# Patient Record
Sex: Female | Born: 1975
Health system: Southern US, Community
[De-identification: ages and names within clinical notes are randomized; demographics above are authoritative.]

## PROBLEM LIST (undated history)

## (undated) DIAGNOSIS — E079 Disorder of thyroid, unspecified: Secondary | ICD-10-CM

## (undated) DIAGNOSIS — R51 Headache: Secondary | ICD-10-CM

## (undated) DIAGNOSIS — E039 Hypothyroidism, unspecified: Secondary | ICD-10-CM

## (undated) DIAGNOSIS — G43909 Migraine, unspecified, not intractable, without status migrainosus: Secondary | ICD-10-CM

## (undated) DIAGNOSIS — R519 Headache, unspecified: Secondary | ICD-10-CM

## (undated) DIAGNOSIS — F419 Anxiety disorder, unspecified: Secondary | ICD-10-CM

## (undated) HISTORY — DX: Headache, unspecified: R51.9

## (undated) HISTORY — DX: Headache: R51

## (undated) HISTORY — DX: Migraine, unspecified, not intractable, without status migrainosus: G43.909

## (undated) HISTORY — DX: Disorder of thyroid, unspecified: E07.9

---
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
## 2004-02-28 DIAGNOSIS — E079 Disorder of thyroid, unspecified: Secondary | ICD-10-CM

## 2004-02-28 HISTORY — DX: Disorder of thyroid, unspecified: E07.9

## 2005-11-03 ENCOUNTER — Ambulatory Visit: Payer: Self-pay | Admitting: Unknown Physician Specialty

## 2005-12-20 ENCOUNTER — Ambulatory Visit: Payer: Self-pay | Admitting: Otolaryngology

## 2006-05-12 ENCOUNTER — Ambulatory Visit: Payer: Self-pay | Admitting: Otolaryngology

## 2006-08-15 ENCOUNTER — Ambulatory Visit: Payer: Self-pay | Admitting: Pain Medicine

## 2008-09-30 ENCOUNTER — Emergency Department: Payer: Self-pay | Admitting: Emergency Medicine

## 2010-01-18 ENCOUNTER — Emergency Department: Payer: Self-pay | Admitting: Internal Medicine

## 2012-05-09 ENCOUNTER — Ambulatory Visit: Payer: Self-pay | Admitting: General Practice

## 2012-06-03 ENCOUNTER — Ambulatory Visit: Payer: Self-pay | Admitting: General Surgery

## 2012-12-30 LAB — HM PAP SMEAR: HM Pap smear: NORMAL

## 2013-03-31 LAB — HM MAMMOGRAPHY: HM Mammogram: NORMAL

## 2013-04-27 LAB — BASIC METABOLIC PANEL: Creatinine: 0.8 mg/dL (ref <=1.1)

## 2013-05-20 LAB — TSH: TSH: 16.55 u[IU]/mL — AB (ref <=5.90)

## 2014-01-27 LAB — BASIC METABOLIC PANEL
BUN: 12 mg/dL (ref 4–21)
Glucose: 87 mg/dL
Potassium: 4 mmol/L (ref 3.4–5.3)
Sodium: 137 mmol/L (ref 137–147)

## 2014-01-27 LAB — HEPATIC FUNCTION PANEL
ALT: 12 U/L (ref 7–35)
AST: 43 U/L — AB (ref 13–35)
Bilirubin, Total: 0.3 mg/dL

## 2014-01-27 LAB — LIPID PANEL
Cholesterol: 45 mg/dL (ref 0–200)
HDL: 45 mg/dL (ref 35–70)
LDL Cholesterol: 107 mg/dL
LDl/HDL Ratio: 3.9
TRIGLYCERIDES: 115 mg/dL (ref 40–160)

## 2014-01-27 LAB — CBC AND DIFFERENTIAL
HCT: 37 % (ref 36–46)
Hemoglobin: 12.4 g/dL (ref 12.0–16.0)
PLATELETS: 275 10*3/uL (ref 150–399)
WBC: 10.5 10^3/mL

## 2014-04-27 ENCOUNTER — Encounter: Payer: Self-pay | Admitting: Nurse Practitioner

## 2014-04-27 ENCOUNTER — Encounter (INDEPENDENT_AMBULATORY_CARE_PROVIDER_SITE_OTHER): Payer: Self-pay

## 2014-04-27 ENCOUNTER — Ambulatory Visit (INDEPENDENT_AMBULATORY_CARE_PROVIDER_SITE_OTHER): Payer: Self-pay | Admitting: Nurse Practitioner

## 2014-04-27 VITALS — BP 120/74 | HR 84 | Temp 97.4°F | Resp 12 | Ht 65.0 in | Wt 166.8 lb

## 2014-04-27 DIAGNOSIS — Z7189 Other specified counseling: Secondary | ICD-10-CM

## 2014-04-27 DIAGNOSIS — Z7689 Persons encountering health services in other specified circumstances: Secondary | ICD-10-CM

## 2014-04-27 DIAGNOSIS — N6320 Unspecified lump in the left breast, unspecified quadrant: Secondary | ICD-10-CM | POA: Insufficient documentation

## 2014-04-27 DIAGNOSIS — E039 Hypothyroidism, unspecified: Secondary | ICD-10-CM | POA: Insufficient documentation

## 2014-04-27 DIAGNOSIS — Z1239 Encounter for other screening for malignant neoplasm of breast: Secondary | ICD-10-CM | POA: Insufficient documentation

## 2014-04-27 DIAGNOSIS — N63 Unspecified lump in breast: Secondary | ICD-10-CM

## 2014-04-27 LAB — TSH: TSH: 0.46 u[IU]/mL (ref 0.35–4.50)

## 2014-04-27 LAB — T4, FREE: FREE T4: 1.27 ng/dL (ref 0.60–1.60)

## 2014-04-27 MED ORDER — ESCITALOPRAM OXALATE 10 MG PO TABS: 10.0000 mg | ORAL_TABLET | Freq: Every day | ORAL | Status: DC

## 2014-04-27 MED ORDER — LEVOTHYROXINE SODIUM 200 MCG PO TABS: 200.0000 ug | ORAL_TABLET | Freq: Every day | ORAL | Status: DC

## 2014-04-27 NOTE — Assessment & Plan Note (Signed)
Will refer for diagnostic mammogram and US of left breast.

## 2014-04-27 NOTE — Assessment & Plan Note (Signed)
Discussed acute and chronic issues. Reviewed health maintenance measures, PFSHx, and immunizations.   

## 2014-04-27 NOTE — Progress Notes (Addendum)
Subjective:    Patient ID: Rachel Hurst, female    DOB: 06/29/1975, 39 y.o.   MRN: [id number]  HPI  [name] is a 39 yo female here to establish care.   1) Health Maintenance-   Diet- Normal diet  Exercise- No formal   Immunizations- Up to date   Mammogram- Had one in past 2014 had diagnostic mammogram of left breast and Ultrasound. Was to follow up in 6 months, but missed due to mother being hospitalized.   Pap- 1.5 years ago   Eye Exam- A few years ago  Dental Exam- Up to date   2) Chronic Problems-  Frequent headaches/Migraines- controlled with topamax  Hypothyroidism- Seen previously by [facility]   150 mg daily for 3 weeks because she ran out of 200 mg    A few months ago was last check up   Taking appropriately on empty stomach in am   Anxiety controlled with Lexapro 3) Acute Problems-  Wants thyroid checked again today. Ran out of 200 mg and previous office would not refill. She has been taking 150 mg pills leftover. Needs refill  See mammogram above   Review of Systems  Constitutional: Positive for diaphoresis. Negative for fever, chills and fatigue.       Night sweats 2-3 x a week   HENT: Negative for tinnitus and trouble swallowing.   Eyes: Negative for visual disturbance.  Respiratory: Negative for chest tightness, shortness of breath and wheezing.   Cardiovascular: Negative for chest pain, palpitations and leg swelling.  Gastrointestinal: Negative for nausea, vomiting, diarrhea and constipation.  Endocrine: Positive for cold intolerance. Negative for heat intolerance.  Genitourinary: Negative for dysuria.  Musculoskeletal: Negative for myalgias and arthralgias.  Skin: Negative for rash.  Neurological: Negative for dizziness, weakness, numbness and headaches.  Hematological: Does not bruise/bleed easily.  Psychiatric/Behavioral: Negative for suicidal ideas. The patient is nervous/anxious.    Past Medical History  Diagnosis Date  . Frequent headaches     . Migraine   . Thyroid disease 2006    Low thyroid    History   Social History  . Marital Status: Married    Spouse Name: N/A  . Number of Children: N/A  . Years of Education: N/A   Occupational History  . Not on file.   Social History Main Topics  . Smoking status: Never Smoker   . Smokeless tobacco: Never Used  . Alcohol Use: 0.6 oz/week    1 Glasses of wine per week  . Drug Use: Not on file  . Sexual Activity: Yes   Other Topics Concern  . Not on file   Social History Narrative   Works third shift       Past Surgical History  Procedure Laterality Date  . Tonsillectomy and adenoidectomy  1976    Family History  Problem Relation Age of Onset  . Hyperlipidemia Mother   . Hyperlipidemia Sister   . Diabetes Sister     No Known Allergies  No current outpatient prescriptions on file prior to visit.   No current facility-administered medications on file prior to visit.      Objective:   Physical Exam  Constitutional: She is oriented to person, place, and time. She appears well-developed and well-nourished. No distress.  BP 120/74 mmHg  Pulse 84  Temp(Src) 97.4 F (36.3 C) (Oral)  Resp 12  Ht 5\' 5"  (1.651 m)  Wt 166 lb 12.8 oz (75.66 kg)  BMI 27.76 kg/m2  SpO2 98%  LMP    HENT:  Head: Normocephalic and atraumatic.  Right Ear: External ear normal.  Left Ear: External ear normal.  Eyes: Right eye exhibits no discharge. Left eye exhibits no discharge. No scleral icterus.  Neck: Normal range of motion. Neck supple. No thyromegaly present.  Cardiovascular: Normal rate, regular rhythm and normal heart sounds.  Exam reveals no gallop and no friction rub.   No murmur heard. Pulmonary/Chest: Effort normal and breath sounds normal. No respiratory distress. She has no wheezes. She has no rales. She exhibits no tenderness.    Lymphadenopathy:    She has no cervical adenopathy.  Neurological: She is alert and oriented to person, place, and time. No cranial  nerve deficit. She exhibits normal muscle tone. Coordination normal.  Skin: Skin is warm and dry. No rash noted. She is not diaphoretic.  Psychiatric: She has a normal mood and affect. Her behavior is normal. Judgment and thought content normal.      Assessment & Plan:

## 2014-04-27 NOTE — Progress Notes (Signed)
Pre visit review using our clinic review tool, if applicable. No additional management support is needed unless otherwise documented below in the visit note. 

## 2014-04-27 NOTE — Addendum Note (Signed)
Addended by: Carollee LeitzSS, Rylyn Zawistowski M on: 04/27/2014 09:53 AM   Modules accepted: Orders

## 2014-04-27 NOTE — Assessment & Plan Note (Addendum)
Previous hypothyroidism. Will check labs TSH, T4 free, T3, and TPO for further information. She reports it was never followed closely and that they upped her each time to a different dose of medication. Would like complete panel for thyroid. If anything is abnormal will possibly refer to Dr. Welford RochePhadke. Discussed with pt and she is agreeable if this is needed. Refilled 200 mg Levothyroxine for the time being since she is out currently.

## 2014-04-27 NOTE — Patient Instructions (Signed)
Hypothyroidism The thyroid is a large gland located in the lower front of your neck. The thyroid gland helps control metabolism. Metabolism is how your body handles food. It controls metabolism with the hormone thyroxine. When this gland is underactive (hypothyroid), it produces too little hormone.  CAUSES These include:   Absence or destruction of thyroid tissue.  Goiter due to iodine deficiency.  Goiter due to medications.  Congenital defects (since birth).  Problems with the pituitary. This causes a lack of TSH (thyroid stimulating hormone). This hormone tells the thyroid to turn out more hormone. SYMPTOMS  Lethargy (feeling as though you have no energy)  Cold intolerance  Weight gain (in spite of normal food intake)  Dry skin  Coarse hair  Menstrual irregularity (if severe, may lead to infertility)  Slowing of thought processes Cardiac problems are also caused by insufficient amounts of thyroid hormone. Hypothyroidism in the newborn is cretinism, and is an extreme form. It is important that this form be treated adequately and immediately or it will lead rapidly to retarded physical and mental development. DIAGNOSIS  To prove hypothyroidism, your caregiver may do blood tests and ultrasound tests. Sometimes the signs are hidden. It may be necessary for your caregiver to watch this illness with blood tests either before or after diagnosis and treatment. TREATMENT  Low levels of thyroid hormone are increased by using synthetic thyroid hormone. This is a safe, effective treatment. It usually takes about four weeks to gain the full effects of the medication. After you have the full effect of the medication, it will generally take another four weeks for problems to leave. Your caregiver may start you on low doses. If you have had heart problems the dose may be gradually increased. It is generally not an emergency to get rapidly to normal. HOME CARE INSTRUCTIONS   Take your  medications as your caregiver suggests. Let your caregiver know of any medications you are taking or start taking. Your caregiver will help you with dosage schedules.  As your condition improves, your dosage needs may increase. It will be necessary to have continuing blood tests as suggested by your caregiver.  Report all suspected medication side effects to your caregiver. SEEK MEDICAL CARE IF: Seek medical care if you develop:  Sweating.  Tremulousness (tremors).  Anxiety.  Rapid weight loss.  Heat intolerance.  Emotional swings.  Diarrhea.  Weakness. SEEK IMMEDIATE MEDICAL CARE IF:  You develop chest pain, an irregular heart beat (palpitations), or a rapid heart beat. MAKE SURE YOU:   Understand these instructions.  Will watch your condition.  Will get help right away if you are not doing well or get worse. Document Released: 02/13/2005 Document Revised: 05/08/2011 Document Reviewed: 10/04/2007 Exit[employer] Patient Information 2015 [employer]. This information is not intended to replace advice given to you by your health care provider. Make sure you discuss any questions you have with your health care provider.  

## 2014-04-28 LAB — THYROID PEROXIDASE ANTIBODY: THYROID PEROXIDASE ANTIBODY: 109 [IU]/mL — AB (ref <9)

## 2014-04-28 LAB — T3: T3 TOTAL: 93.3 ng/dL (ref 80.0–204.0)

## 2014-05-13 ENCOUNTER — Encounter: Payer: Self-pay | Admitting: Nurse Practitioner

## 2014-05-29 ENCOUNTER — Other Ambulatory Visit: Payer: Self-pay | Admitting: Nurse Practitioner

## 2014-06-11 ENCOUNTER — Encounter (INDEPENDENT_AMBULATORY_CARE_PROVIDER_SITE_OTHER): Payer: Self-pay

## 2014-06-24 ENCOUNTER — Ambulatory Visit
Admit: 2014-06-24 | Disposition: A | Discharge status: Home / Self Care | Payer: Self-pay | Attending: Nurse Practitioner | Admitting: Nurse Practitioner

## 2014-06-24 ENCOUNTER — Other Ambulatory Visit: Payer: Self-pay | Admitting: Nurse Practitioner

## 2014-06-24 DIAGNOSIS — Z1239 Encounter for other screening for malignant neoplasm of breast: Secondary | ICD-10-CM

## 2014-10-02 ENCOUNTER — Other Ambulatory Visit: Payer: Self-pay | Admitting: Nurse Practitioner

## 2014-10-19 ENCOUNTER — Encounter: Payer: Self-pay | Admitting: Nurse Practitioner

## 2015-02-17 ENCOUNTER — Other Ambulatory Visit: Payer: Self-pay | Admitting: Nurse Practitioner

## 2015-08-03 ENCOUNTER — Encounter: Payer: Self-pay | Admitting: Nurse Practitioner

## 2015-08-04 ENCOUNTER — Other Ambulatory Visit: Payer: Self-pay | Admitting: Family Medicine

## 2015-08-04 MED ORDER — LEVOTHYROXINE SODIUM 200 MCG PO TABS: 200.0000 ug | ORAL_TABLET | Freq: Every day | ORAL | Status: DC

## 2015-09-16 ENCOUNTER — Emergency Department: Payer: Self-pay

## 2015-09-16 ENCOUNTER — Inpatient Hospital Stay
Admission: EM | Admit: 2015-09-16 | Discharge: 2015-09-20 | DRG: 419 | Disposition: A | Discharge status: Home / Self Care | Payer: Self-pay | Attending: Internal Medicine | Admitting: Internal Medicine

## 2015-09-16 DIAGNOSIS — K8062 Calculus of gallbladder and bile duct with acute cholecystitis without obstruction: Principal | ICD-10-CM | POA: Diagnosis present

## 2015-09-16 DIAGNOSIS — K769 Liver disease, unspecified: Secondary | ICD-10-CM | POA: Diagnosis present

## 2015-09-16 DIAGNOSIS — R1011 Right upper quadrant pain: Secondary | ICD-10-CM | POA: Diagnosis present

## 2015-09-16 DIAGNOSIS — K8020 Calculus of gallbladder without cholecystitis without obstruction: Secondary | ICD-10-CM | POA: Insufficient documentation

## 2015-09-16 DIAGNOSIS — R52 Pain, unspecified: Secondary | ICD-10-CM

## 2015-09-16 DIAGNOSIS — K8050 Calculus of bile duct without cholangitis or cholecystitis without obstruction: Secondary | ICD-10-CM | POA: Insufficient documentation

## 2015-09-16 DIAGNOSIS — G43909 Migraine, unspecified, not intractable, without status migrainosus: Secondary | ICD-10-CM | POA: Diagnosis present

## 2015-09-16 DIAGNOSIS — K660 Peritoneal adhesions (postprocedural) (postinfection): Secondary | ICD-10-CM | POA: Diagnosis present

## 2015-09-16 DIAGNOSIS — E039 Hypothyroidism, unspecified: Secondary | ICD-10-CM | POA: Diagnosis present

## 2015-09-16 DIAGNOSIS — F419 Anxiety disorder, unspecified: Secondary | ICD-10-CM | POA: Diagnosis present

## 2015-09-16 HISTORY — DX: Hypothyroidism, unspecified: E03.9

## 2015-09-16 HISTORY — DX: Anxiety disorder, unspecified: F41.9

## 2015-09-16 LAB — URINALYSIS COMPLETE WITH MICROSCOPIC (ARMC ONLY)
BILIRUBIN URINE: NEGATIVE
GLUCOSE, UA: NEGATIVE mg/dL
Hgb urine dipstick: NEGATIVE
Ketones, ur: NEGATIVE mg/dL
Nitrite: NEGATIVE
Protein, ur: NEGATIVE mg/dL
Specific Gravity, Urine: 1.024 (ref 1.005–1.030)
pH: 6 (ref 5.0–8.0)

## 2015-09-16 LAB — CBC
HCT: 41.8 % (ref 35.0–47.0)
Hemoglobin: 14.2 g/dL (ref 12.0–16.0)
MCH: 29.2 pg (ref 26.0–34.0)
MCHC: 34 g/dL (ref 32.0–36.0)
MCV: 86 fL (ref 80.0–100.0)
PLATELETS: 232 10*3/uL (ref 150–440)
RBC: 4.87 MIL/uL (ref 3.80–5.20)
RDW: 13.8 % (ref 11.5–14.5)
WBC: 11.8 10*3/uL — ABNORMAL HIGH (ref 3.6–11.0)

## 2015-09-16 LAB — COMPREHENSIVE METABOLIC PANEL
ALK PHOS: 45 U/L (ref 38–126)
ALT: 16 U/L (ref 14–54)
AST: 26 U/L (ref 15–41)
Albumin: 4.4 g/dL (ref 3.5–5.0)
Anion gap: 7 (ref 5–15)
BUN: 15 mg/dL (ref 6–20)
CALCIUM: 9.7 mg/dL (ref 8.9–10.3)
CHLORIDE: 106 mmol/L (ref 101–111)
CO2: 25 mmol/L (ref 22–32)
CREATININE: 0.95 mg/dL (ref 0.44–1.00)
GFR calc Af Amer: >60 mL/min (ref >60)
Glucose, Bld: 112 mg/dL — ABNORMAL HIGH (ref 65–99)
Potassium: 4.3 mmol/L (ref 3.5–5.1)
SODIUM: 138 mmol/L (ref 135–145)
Total Bilirubin: 0.6 mg/dL (ref 0.3–1.2)
Total Protein: 7.8 g/dL (ref 6.5–8.1)

## 2015-09-16 LAB — POCT PREGNANCY, URINE: PREG TEST UR: NEGATIVE

## 2015-09-16 LAB — TROPONIN I: Troponin I: <0.03 ng/mL (ref <0.03)

## 2015-09-16 LAB — LIPASE, BLOOD: LIPASE: 30 U/L (ref 11–51)

## 2015-09-16 MED ORDER — ACETAMINOPHEN 325 MG PO TABS
650.0000 mg | ORAL_TABLET | Freq: Once | ORAL | Status: AC
  Administered 2015-09-16: 650 mg via ORAL
  Filled 2015-09-16: qty 2

## 2015-09-16 MED ORDER — SODIUM CHLORIDE 0.9 % IV BOLUS (SEPSIS)
1000.0000 mL | Freq: Once | INTRAVENOUS | Status: AC
  Administered 2015-09-16: 1000 mL via INTRAVENOUS

## 2015-09-16 MED ORDER — MORPHINE SULFATE (PF) 4 MG/ML IV SOLN
4.0000 mg | Freq: Once | INTRAVENOUS | Status: AC
  Administered 2015-09-16: 4 mg via INTRAVENOUS
  Filled 2015-09-16: qty 1

## 2015-09-16 MED ORDER — GADOBENATE DIMEGLUMINE 529 MG/ML IV SOLN
15.0000 mL | Freq: Once | INTRAVENOUS | Status: AC | PRN
  Administered 2015-09-16: 15 mL via INTRAVENOUS

## 2015-09-16 MED ORDER — ONDANSETRON HCL 4 MG/2ML IJ SOLN
4.0000 mg | Freq: Once | INTRAMUSCULAR | Status: AC
  Administered 2015-09-16: 4 mg via INTRAVENOUS
  Filled 2015-09-16: qty 2

## 2015-09-16 MED ORDER — GI COCKTAIL ~~LOC~~
30.0000 mL | Freq: Once | ORAL | Status: DC
  Filled 2015-09-16: qty 30

## 2015-09-16 NOTE — ED Provider Notes (Signed)
[facility] Emergency Department Provider Note  Time seen: 1:25 PM  I have reviewed the triage vital signs and the nursing notes.   HISTORY  Chief Complaint Abdominal Pain    HPI ORIAN AMBERG is a 40 y.o. female with no past medical history who presents the emergency department with upper abdominal pain for the past 2 days. According to the patient 2 days ago she developed upper abdominal pain along with nausea, and a feeling of flushing/lightheadedness. Patient states since that time she is continued to have abdominal pain, somewhat worse after she eats. Also states occasional loose stool. Denies any further nausea or vomiting. Denies any fever. Denies dysuria or lower abdominal discomfort. Describes her pain currently as moderate dull upper abdominal pain 4/10.     History reviewed. No pertinent past medical history.  There are no active problems to display for this patient.   History reviewed. No pertinent past surgical history.  No current outpatient prescriptions on file.  Allergies Review of patient's allergies indicates no known allergies.  No family history on file.  Social History Social History  Substance Use Topics  . Smoking status: Never Smoker   . Smokeless tobacco: None  . Alcohol Use: Yes    Review of Systems Constitutional: Negative for fever. Cardiovascular: Negative for chest pain. Respiratory: Negative for shortness of breath. Gastrointestinal: Moderate dull upper abdominal pain, 4/10. Negative for nausea, vomiting, diarrhea. Genitourinary: Negative for dysuria. Musculoskeletal: Patient states some radiation of the pain to her right mid back Neurological: Negative for headache 10-point ROS otherwise negative.  ____________________________________________   PHYSICAL EXAM:  VITAL SIGNS: ED Triage Vitals  Enc Vitals Group     BP 09/16/15 1107 112/59 mmHg     Pulse Rate 09/16/15 1107 83     Resp 09/16/15 1107 22      Temp 09/16/15 1107 97.9 F (36.6 C)     Temp Source 09/16/15 1107 Oral     SpO2 09/16/15 1107 100 %     Weight 09/16/15 1107 170 lb (77.111 kg)     Height 09/16/15 1107  (1.626 m)     Head Cir --      Peak Flow --      Pain Score 09/16/15 1108 8     Pain Loc --      Pain Edu? --      Excl. in GC? --    Constitutional: Alert and oriented. Well appearing and in no distress. Eyes: Normal exam ENT   Head: Normocephalic and atraumatic.   Mouth/Throat: Mucous membranes are moist. Cardiovascular: Normal rate, regular rhythm. No murmur Respiratory: Normal respiratory effort without tachypnea nor retractions. Breath sounds are clear  Gastrointestinal: Soft, mild epigastric tenderness palpation, no rebound or guarding. No distention.  Musculoskeletal: Nontender with normal range of motion in all extremities Neurologic:  Normal speech and language. No gross focal neurologic deficits Skin:  Skin is warm, dry and intact.  Psychiatric: Mood and affect are normal.  ____________________________________________    EKG  EKG reviewed and interpreted by massage shows normal sinus rhythm at 87 bpm, narrow QRS, normal axis, normal intervals, nonspecific ST changes without ST elevation.  ____________________________________________    RADIOLOGY  Ultrasound shows multiple gallstones with a 1 cm CBD.  ____________________________________________    INITIAL IMPRESSION / ASSESSMENT AND PLAN / ED COURSE  Pertinent labs & imaging results that were available during my care of the patient were reviewed by me and considered in my medical decision making (  see chart for details).  Patient presents the emergency department upper abdominal pain for the past 2 days initially with nausea and vomiting, none since. Denies dysuria or any other lower abdominal symptoms. Patient does have mild epigastric tenderness palpation. Patient describes moderate abdominal pain somewhat worse after she  eats. We will obtain a right upper quadrant which sound to further evaluate the patient's gallbladder. Otherwise the patient's lab work shows a mild leukocytosis of 11,800, with normal LFTs.  We have no GI coverage today, I discussed the patient with [facility] who recommends we obtain an MRCP. MRCP is positive for stones and hypertension are [facility] to be seen by GI medicine. If the MRCP is negative we will consult Gen. surgery to have the patient evaluated. Patient states 6/10 pain currently she is receiving for more milligrams of morphine at this time.  MRCP pending, patient care signed out to Dr. Emmit AlexandersMcSchane.  ____________________________________________   FINAL CLINICAL IMPRESSION(S) / ED DIAGNOSES  Biliary colic Cholelithiasis   Minna AntisKevin Aziah Kaiser, MD 09/16/15 1527

## 2015-09-16 NOTE — ED Notes (Signed)
Administered medication to patient and started fluids per MAR.  Pt appears comfortable and is watching TV and talking with family at bedside.

## 2015-09-16 NOTE — ED Notes (Signed)
Pt resting in bed at this time. VSS. Will continue to monitor for further patient needs. Explained to patient to delay for going to MRI at this time.

## 2015-09-16 NOTE — ED Notes (Signed)
Pt to MRI prior to start of shift.

## 2015-09-16 NOTE — ED Notes (Signed)
MD at bedside. 

## 2015-09-16 NOTE — ED Notes (Signed)
Pt back from MRI, pt complaining of headache at this time.  EDP notified and orders placed.

## 2015-09-16 NOTE — ED Notes (Signed)
EDP requested that pt be given food.  Sandwich tray and ice water provided for patient.  Patient reporting that headache still present, will notify EDP.

## 2015-09-16 NOTE — ED Notes (Signed)
Pt arrives to ER via POV c/o right and left upper abdominal pain. Denies CP. Pt alert and oriented X4, active, cooperative, pt in NAD. RR even and unlabored, color WNL.

## 2015-09-16 NOTE — ED Notes (Signed)
Pt returned from US at this time.

## 2015-09-16 NOTE — ED Notes (Signed)
Pt states per MRI her daith peircings have to come out. This RN loosened piercings and instructed patient and MRI that upon her arrival they needed to be removed, pt was hesitant to remove and let the piercing stay out for an extended period of time.

## 2015-09-16 NOTE — ED Provider Notes (Addendum)
-----------------------------------------   5:27 PM on 09/16/2015 ----------------------------------------- Signed out to me awaiting MRCP.  Jeanmarie PlantJames A Knut Rondinelli, MD   ----------------------------------------- 8:31 PM on 09/16/2015 -----------------------------------------  Patient's in no acute distress has a mild headache with like Tylenol with her to eat and drink, still with reproducible abdominal discomfort but no significant pain or fraction tests are reassuring awaiting MRCP results for dispositional decisions  Jeanmarie PlantJames A Greysen Swanton, MD 09/16/15 2031  ----------------------------------------- 10:07 PM on 09/16/2015 -----------------------------------------  [facility] still awaiting a call back. She material 10 but ongoing discomfort. Declines further pain medication.   ----------------------------------------- 10:56 PM on 09/16/2015 -----------------------------------------  Had extensive discussion with Dr. Cheryl FlashSchuler of [facility] who graciously called me back. He feels that patient does not [facility]. We are told that there will be a GI doctor on-call tomorrow. This no evidence of obstruction, and her liver function tests and lipase are reassuring. For this reason therefore even though she has stones in the common bile duct he is advised that she stay here until Dr. Mechele CollinElliott, who I am told is on call tomorrow, can evaluate the patient. He does not, and otherwise, except the patient. I discussed with the hospitalist on call here and she agrees with the plan and will admit. Patient will try to eat although she has been reluctant to try prior to this because of abdominal pain. Hospitalist agrees that as there is no procedure being scheduled for tonight, patient can take by mouth.  Jeanmarie PlantJames A Mabel Unrein, MD 09/16/15 2258

## 2015-09-16 NOTE — ED Notes (Signed)
Pt given phone for MRI questions.

## 2015-09-17 ENCOUNTER — Encounter
Admission: EM | Disposition: A | Discharge status: Home / Self Care | Payer: Self-pay | Source: Home / Self Care | Attending: Internal Medicine

## 2015-09-17 ENCOUNTER — Inpatient Hospital Stay: Payer: Self-pay

## 2015-09-17 ENCOUNTER — Inpatient Hospital Stay: Payer: MEDICAID | Admitting: Anesthesiology

## 2015-09-17 ENCOUNTER — Other Ambulatory Visit: Payer: Self-pay

## 2015-09-17 ENCOUNTER — Inpatient Hospital Stay: Payer: Self-pay | Admitting: Anesthesiology

## 2015-09-17 ENCOUNTER — Encounter: Payer: Self-pay | Admitting: Family Medicine

## 2015-09-17 DIAGNOSIS — K8050 Calculus of bile duct without cholangitis or cholecystitis without obstruction: Secondary | ICD-10-CM | POA: Insufficient documentation

## 2015-09-17 DIAGNOSIS — R1011 Right upper quadrant pain: Secondary | ICD-10-CM | POA: Diagnosis present

## 2015-09-17 HISTORY — PX: ERCP: SHX5425

## 2015-09-17 LAB — COMPREHENSIVE METABOLIC PANEL
ALBUMIN: 3.6 g/dL (ref 3.5–5.0)
ALBUMIN: 3.6 g/dL (ref 3.5–5.0)
ALK PHOS: 45 U/L (ref 38–126)
ALK PHOS: 44 U/L (ref 38–126)
ALT: 38 U/L (ref 14–54)
ALT: 35 U/L (ref 14–54)
ANION GAP: 2 — AB (ref 5–15)
AST: 32 U/L (ref 15–41)
AST: 25 U/L (ref 15–41)
Anion gap: 3 — ABNORMAL LOW (ref 5–15)
BILIRUBIN TOTAL: 0.9 mg/dL (ref 0.3–1.2)
BILIRUBIN TOTAL: 0.6 mg/dL (ref 0.3–1.2)
BUN: 11 mg/dL (ref 6–20)
BUN: 7 mg/dL (ref 6–20)
CALCIUM: 8.2 mg/dL — AB (ref 8.9–10.3)
CALCIUM: 8.4 mg/dL — AB (ref 8.9–10.3)
CO2: 26 mmol/L (ref 22–32)
CO2: 22 mmol/L (ref 22–32)
Chloride: 111 mmol/L (ref 101–111)
Chloride: 112 mmol/L — ABNORMAL HIGH (ref 101–111)
Creatinine, Ser: 0.85 mg/dL (ref 0.44–1.00)
Creatinine, Ser: 0.78 mg/dL (ref 0.44–1.00)
GFR calc Af Amer: >60 mL/min (ref >60)
GFR calc Af Amer: >60 mL/min (ref >60)
GFR calc non Af Amer: >60 mL/min (ref >60)
GLUCOSE: 99 mg/dL (ref 65–99)
GLUCOSE: 100 mg/dL — AB (ref 65–99)
Potassium: 4 mmol/L (ref 3.5–5.1)
Potassium: 4 mmol/L (ref 3.5–5.1)
Sodium: 139 mmol/L (ref 135–145)
Sodium: 137 mmol/L (ref 135–145)
TOTAL PROTEIN: 6.4 g/dL — AB (ref 6.5–8.1)
TOTAL PROTEIN: 6.4 g/dL — AB (ref 6.5–8.1)

## 2015-09-17 LAB — CBC
HCT: 36.4 % (ref 35.0–47.0)
HEMOGLOBIN: 12.8 g/dL (ref 12.0–16.0)
MCH: 30.4 pg (ref 26.0–34.0)
MCHC: 35.2 g/dL (ref 32.0–36.0)
MCV: 86.2 fL (ref 80.0–100.0)
Platelets: 190 10*3/uL (ref 150–440)
RBC: 4.22 MIL/uL (ref 3.80–5.20)
RDW: 14 % (ref 11.5–14.5)
WBC: 8.8 10*3/uL (ref 3.6–11.0)

## 2015-09-17 LAB — PROTIME-INR
INR: 1.23
PROTHROMBIN TIME: 15.7 s — AB (ref 11.4–15.0)

## 2015-09-17 LAB — APTT: APTT: 30 s (ref 24–36)

## 2015-09-17 SURGERY — ERCP, WITH INTERVENTION IF INDICATED
Anesthesia: General

## 2015-09-17 MED ORDER — BUTALBITAL-APAP-CAFFEINE 50-325-40 MG PO TABS
1.0000 | ORAL_TABLET | ORAL | Status: DC | PRN
  Administered 2015-09-17 – 2015-09-20 (×4): 1 via ORAL
  Filled 2015-09-17 (×4): qty 1

## 2015-09-17 MED ORDER — PROPOFOL 500 MG/50ML IV EMUL
INTRAVENOUS | Status: DC | PRN
  Administered 2015-09-17: 100 ug/kg/min via INTRAVENOUS

## 2015-09-17 MED ORDER — OXYCODONE-ACETAMINOPHEN 5-325 MG PO TABS
ORAL_TABLET | ORAL | Status: AC
  Filled 2015-09-17: qty 1

## 2015-09-17 MED ORDER — ESCITALOPRAM OXALATE 10 MG PO TABS
10.0000 mg | ORAL_TABLET | Freq: Every day | ORAL | Status: DC
  Administered 2015-09-17 – 2015-09-20 (×4): 10 mg via ORAL
  Filled 2015-09-17 (×4): qty 1

## 2015-09-17 MED ORDER — ONDANSETRON HCL 4 MG/2ML IJ SOLN
INTRAMUSCULAR | Status: DC | PRN
  Administered 2015-09-17: 4 mg via INTRAVENOUS

## 2015-09-17 MED ORDER — ACETAMINOPHEN 650 MG RE SUPP: 650.0000 mg | Freq: Four times a day (QID) | RECTAL | Status: DC | PRN

## 2015-09-17 MED ORDER — INDOMETHACIN 50 MG RE SUPP
100.0000 mg | Freq: Once | RECTAL | Status: AC
  Administered 2015-09-17: 100 mg via RECTAL
  Filled 2015-09-17: qty 2

## 2015-09-17 MED ORDER — ONDANSETRON HCL 4 MG/2ML IJ SOLN: 4.0000 mg | Freq: Once | INTRAMUSCULAR | Status: DC | PRN

## 2015-09-17 MED ORDER — FENTANYL CITRATE (PF) 100 MCG/2ML IJ SOLN: 25.0000 ug | INTRAMUSCULAR | Status: DC | PRN

## 2015-09-17 MED ORDER — ACETAMINOPHEN 325 MG PO TABS
650.0000 mg | ORAL_TABLET | Freq: Four times a day (QID) | ORAL | Status: DC | PRN
  Administered 2015-09-17: 09:00:00 650 mg via ORAL
  Filled 2015-09-17: qty 2

## 2015-09-17 MED ORDER — FENTANYL CITRATE (PF) 100 MCG/2ML IJ SOLN
INTRAMUSCULAR | Status: DC | PRN
  Administered 2015-09-17: 50 ug via INTRAVENOUS

## 2015-09-17 MED ORDER — MIDAZOLAM HCL 2 MG/2ML IJ SOLN
INTRAMUSCULAR | Status: DC | PRN
  Administered 2015-09-17: 2 mg via INTRAVENOUS

## 2015-09-17 MED ORDER — OXYCODONE-ACETAMINOPHEN 5-325 MG PO TABS: 1.0000 | ORAL_TABLET | Freq: Once | ORAL | Status: DC

## 2015-09-17 MED ORDER — PROPOFOL 10 MG/ML IV BOLUS
INTRAVENOUS | Status: DC | PRN
  Administered 2015-09-17: 80 mg via INTRAVENOUS

## 2015-09-17 MED ORDER — SODIUM CHLORIDE 0.9 % IV SOLN
INTRAVENOUS | Status: DC
  Administered 2015-09-17: 16:00:00 via INTRAVENOUS

## 2015-09-17 MED ORDER — ONDANSETRON HCL 4 MG/2ML IJ SOLN: 4.0000 mg | Freq: Four times a day (QID) | INTRAMUSCULAR | Status: DC | PRN

## 2015-09-17 MED ORDER — OXYCODONE-ACETAMINOPHEN 5-325 MG PO TABS
1.0000 | ORAL_TABLET | Freq: Once | ORAL | Status: AC
  Administered 2015-09-17: 1 via ORAL
  Filled 2015-09-17: qty 1

## 2015-09-17 MED ORDER — DEXTROSE-NACL 5-0.9 % IV SOLN
INTRAVENOUS | Status: DC
  Administered 2015-09-17 (×3): via INTRAVENOUS

## 2015-09-17 MED ORDER — HYDROMORPHONE HCL 1 MG/ML IJ SOLN
1.0000 mg | Freq: Once | INTRAMUSCULAR | Status: AC
  Administered 2015-09-17: 1 mg via INTRAVENOUS
  Filled 2015-09-17: qty 1

## 2015-09-17 MED ORDER — LEVOFLOXACIN IN D5W 500 MG/100ML IV SOLN
500.0000 mg | Freq: Once | INTRAVENOUS | Status: AC
  Administered 2015-09-17: 500 mg via INTRAVENOUS
  Filled 2015-09-17: qty 100

## 2015-09-17 MED ORDER — LEVOTHYROXINE SODIUM 100 MCG PO TABS
200.0000 ug | ORAL_TABLET | Freq: Every day | ORAL | Status: DC
  Administered 2015-09-17 – 2015-09-20 (×4): 200 ug via ORAL
  Filled 2015-09-17 (×4): qty 2

## 2015-09-17 MED ORDER — ONDANSETRON HCL 4 MG PO TABS: 4.0000 mg | ORAL_TABLET | Freq: Four times a day (QID) | ORAL | Status: DC | PRN

## 2015-09-17 MED ORDER — NAPROXEN 250 MG PO TABS
250.0000 mg | ORAL_TABLET | Freq: Once | ORAL | Status: AC
  Administered 2015-09-17: 250 mg via ORAL
  Filled 2015-09-17 (×2): qty 1

## 2015-09-17 NOTE — Anesthesia Preprocedure Evaluation (Addendum)
Anesthesia Evaluation  Patient identified by MRN, date of birth, ID band Patient awake    Reviewed: Allergy & Precautions, NPO status , Patient's Chart, lab work & pertinent test results, reviewed documented beta blocker date and time   Airway Mallampati: II  TM Distance: >3 FB     Dental  (+) Chipped, Teeth Intact   Pulmonary neg pulmonary ROS,    breath sounds clear to auscultation       Cardiovascular negative cardio ROS   Rhythm:Regular     Neuro/Psych Anxiety negative neurological ROS     GI/Hepatic negative GI ROS, Neg liver ROS,   Endo/Other    Renal/GU      Musculoskeletal negative musculoskeletal ROS (+)   Abdominal Normal abdominal exam  (+)   Peds negative pediatric ROS (+)  Hematology   Anesthesia Other Findings   Reproductive/Obstetrics negative OB ROS                           Anesthesia Physical Anesthesia Plan  ASA: II  Anesthesia Plan: General   Post-op Pain Management:    Induction: Intravenous  Airway Management Planned:   Additional Equipment:   Intra-op Plan:   Post-operative Plan:   Informed Consent: I have reviewed the patients History and Physical, chart, labs and discussed the procedure including the risks, benefits and alternatives for the proposed anesthesia with the patient or authorized representative who has indicated his/her understanding and acceptance.     Plan Discussed with: CRNA  Anesthesia Plan Comments:         Anesthesia Quick Evaluation

## 2015-09-17 NOTE — Progress Notes (Signed)
[facility] [place] at [facility]   PATIENT NAME: Rachel Hurst    MR#:  [id number]  DATE OF BIRTH:  07/28/1975  SUBJECTIVE:  CHIEF COMPLAINT:  Patient is resting comfortably. Reports some right-sided abdominal pain. Denies any nausea or vomiting.  REVIEW OF SYSTEMS:  CONSTITUTIONAL: No fever, fatigue or weakness.  EYES: No blurred or double vision.  EARS, NOSE, AND THROAT: No tinnitus or ear pain.  RESPIRATORY: No cough, shortness of breath, wheezing or hemoptysis.  CARDIOVASCULAR: No chest pain, orthopnea, edema.  GASTROINTESTINAL: No nausea, vomiting, diarrhea . Reports right-sidedbdominal pain.  GENITOURINARY: No dysuria, hematuria.  ENDOCRINE: No polyuria, nocturia,  HEMATOLOGY: No anemia, easy bruising or bleeding SKIN: No rash or lesion. MUSCULOSKELETAL: No joint pain or arthritis.   NEUROLOGIC: No tingling, numbness, weakness.  PSYCHIATRY: No anxiety or depression.   DRUG ALLERGIES:  No Known Allergies  VITALS:  Blood pressure 99/55, pulse 67, temperature 98.8 F (37.1 C), temperature source Oral, resp. rate 16, height 5\' 4"  (1.626 m), weight 81.511 kg (179 lb 11.2 oz), SpO2 98 %.  PHYSICAL EXAMINATION:  GENERAL:  40 y.o.-year-old patient lying in the bed with no acute distress.  EYES: Pupils equal, round, reactive to light and accommodation. No scleral icterus. Extraocular muscles intact.  HEENT: Head atraumatic, normocephalic. Oropharynx and nasopharynx clear.  NECK:  Supple, no jugular venous distention. No thyroid enlargement, no tenderness.  LUNGS: Normal breath sounds bilaterally, no wheezing, rales,rhonchi or crepitation. No use of accessory muscles of respiration.  CARDIOVASCULAR: S1, S2 normal. No murmurs, rubs, or gallops.  ABDOMEN: Soft, Right upper quadrant tenderness is present but no rebound tenderness, nondistended. Bowel sounds present. No organomegaly or mass.  EXTREMITIES: No pedal edema, cyanosis, or clubbing.   NEUROLOGIC: Cranial nerves II through XII are intact. Muscle strength 5/5 in all extremities. Sensation intact. Gait not checked.  PSYCHIATRIC: The patient is alert and oriented x 3.  SKIN: No obvious rash, lesion, or ulcer.    LABORATORY PANEL:   CBC  Recent Labs Lab 09/17/15 0520  WBC 8.8  HGB 12.8  HCT 36.4  PLT 190   ------------------------------------------------------------------------------------------------------------------  Chemistries   Recent Labs Lab 09/17/15 0520  NA 139  K 4.0  CL 111  CO2 26  GLUCOSE 99  BUN 11  CREATININE 0.85  CALCIUM 8.2*  AST 32  ALT 38  ALKPHOS 45  BILITOT 0.9   ------------------------------------------------------------------------------------------------------------------  Cardiac Enzymes  Recent Labs Lab 09/16/15 1111  TROPONINI <0.03   ------------------------------------------------------------------------------------------------------------------  RADIOLOGY:  Mr Abd W/wo Cm/mrcp  09/16/2015  CLINICAL DATA:  Right upper quadrant pain for 2 days. Nausea. Cholelithiasis and biliary ductal dilatation seen on recent ultrasound. EXAM: MRI ABDOMEN WITHOUT AND WITH CONTRAST (INCLUDING MRCP) TECHNIQUE: Multiplanar multisequence MR imaging of the abdomen was performed both before and after the administration of intravenous contrast. Heavily T2-weighted images of the biliary and pancreatic ducts were obtained, and three-dimensional MRCP images were rendered by post processing. CONTRAST:  15mL MULTIHANCE GADOBENATE DIMEGLUMINE 529 MG/ML IV SOLN COMPARISON:  Ultrasound on 09/16/2015 FINDINGS: Lower chest:  No acute findings. Hepatobiliary: 1.5 cm flash filling lesion is seen in the subcapsular posterior right hepatic lobe, segment 7, on arterial phase imaging, image 22/series 17. This shows T2 isointensity, and likely represents a benign etiology such as focal nodular hyperplasia or perfusion anomaly. No other liver lesions  identified. Multiple gallstones are seen without evidence of acute cholecystitis. Mild biliary ductal dilatation is seen with common bile duct measuring 9 mm. There  are several filling defects seen in the distal common bile duct measuring up to 8 mm, consistent with choledocholithiasis. Pancreas: No mass, inflammatory changes, or other parenchymal abnormality identified. Spleen:  Within normal limits in size and appearance. Adrenals/Urinary Tract: No masses identified. No evidence of hydronephrosis. Mild left lower pole renal scarring noted. Stomach/Bowel: Visualized portions within the abdomen are unremarkable. Vascular/Lymphatic: No pathologically enlarged lymph nodes identified. No abdominal aortic aneurysm demonstrated. Other:  None. Musculoskeletal:  No suspicious bone lesions identified. IMPRESSION: Cholelithiasis, without radiographic signs of acute cholecystitis. Mild biliary ductal dilatation, with multiple small stones in the distal common bile duct. 1.5 cm probably benign lesion in the posterior right hepatic lobe. Continued followup by abdomen MRI without and with Eovist contrast recommended in 6 months. Electronically Signed   By: Myles Rosenthal M.D.   On: 09/16/2015 20:35   US Abdomen Limited Ruq  09/16/2015  CLINICAL DATA:  Upper abdominal pain 3 days with vomiting. EXAM: US ABDOMEN LIMITED - RIGHT UPPER QUADRANT COMPARISON:  None. FINDINGS: Gallbladder: Moderate cholelithiasis with the largest stone measuring 1 cm. No gallbladder wall thickening or pericholecystic fluid. Negative sonographic Murphy's sign. Common bile duct: Diameter: 7 mm proximally and 10 mm distally. Possible cluster of small stones versus single 1 cm stone within the distal common bile duct. Liver: No focal lesion identified. Within normal limits in parenchymal echogenicity. IMPRESSION: Moderate cholelithiasis without sonographic evidence of acute cholecystitis. Findings suggesting distal choledocholithiasis with mild dilatation  of the common bowel duct measuring 1 cm. Consider MRCP for further evaluation. Electronically Signed   By: Elberta Fortis M.D.   On: 09/16/2015 14:06    EKG:   Orders placed or performed during the hospital encounter of 09/16/15  . ED EKG  . ED EKG  . EKG 12-Lead  . EKG 12-Lead    ASSESSMENT AND PLAN:   This is a 39 year old white female with history of anxiety, hypothyroidism and migraines who presents with two days of RUQ abdominal pain and now being admitted for: 1. Biliary colic - cholelithiasis, choledocholithiasis without evidence of acute cholecystitis.  MRCP done, ED physician  consulted [facility] has recommended  inpatient GI consultation for consideration of ERCP. Pending GI consult today  NPO , pain control, antiemetics, IV fluid hydration.   2. Anxiety - continue Lexapro.  3. Migraine headache - continue Naproxen which has helped her in the past.   4. Routine DVT prophylaxis with SCDs and early ambulation given low VTE risk.  5.Incidental posterior liver lesion identified will require outpatient follow up.     All the records are reviewed and case discussed with Care Management/Social Workerr. Management plans discussed with the patient, family and they are in agreement.  CODE STATUS: FC   TOTAL TIME TAKING CARE OF THIS PATIENT: 36  minutes.   POSSIBLE D/C IN 2 DAYS, DEPENDING ON CLINICAL CONDITION.  Note: This dictation was prepared with Dragon dictation along with smaller phrase technology. Any transcriptional errors that result from this process are unintentional.   Ramonita Lab M.D on 09/17/2015 at 2:29 PM  Between 7am to 6pm - Pager - [phone] After 6pm go to [email or web address] - password [email or web address] [facility]  [facility]  Office  [phone]  CC: Primary care physician; No PCP Per Patient

## 2015-09-17 NOTE — Care Management Note (Addendum)
Case Management Note  Patient Details  Name: Rachel Hurst MRN: [id number] Date of Birth: 01/29/1976  Subjective/Objective:                 Spoke with patient at the bedside. She states that she recently lost healthcare coverage when her employment decreased to part time. She is still seen by her PCP at [facility] on [address]. She fills her synthroid and pays $30 for a 3 month supply out of pocket. Patient to have ERCP later today, and will DC today or early tomorrow if results normal. Patient will qualify for MATCH if needed to cover mediactions at DC.   Action/Plan:  DC to home today, patient will not require MATCH at DC.  Expected Discharge Date:                  Expected Discharge Plan:  Home/Self Care  In-House Referral:     Discharge planning Services  CM Consult  Post Acute Care Choice:  NA Choice offered to:  NA  DME Arranged:  N/A DME Agency:  NA  HH Arranged:  NA HH Agency:  NA  Status of Service:  In process, will continue to follow  If discussed at Long Length of Stay Meetings, dates discussed:    Additional Comments:  Lawerance SabalDebbie Tavon Corriher, RN 09/17/2015, 12:22 PM

## 2015-09-17 NOTE — Consult Note (Signed)
Consultation  Referring Provider:Dr. Hugelmeyer  Primary Care Physician:  No PCP Per Patient Consulting  Gastroenterologist:    Dr. Lynnae Prude     Reason for Consultation: Request for urgent ERCP            HPI:   Rachel Hurst is a 40 y.o. female  with history of hypothyroidism, migraines and anxiety who presents with three day history of abdominal pain. She reports right upper quadrant and mid-epigastric abdominal pain beginning Tuesday as tightness that quickly escalated to excruciating 15/10 pain and N?V. This lasted about 15 min and then spontaneously eased off to a 2/10 tightness, discomfort that was manageable. She was able to drink fluids without N/V but was afraid to eat. On Wed  and Thurs she still had a RUQ and low mid sternum bloating, tightness, discomfort but was able to eat a few bites until 11 am when the sharp, excruciating pain started again and she came to the ED. LFTs normal including bilirubin, lipase 30, WBC 11.8 to 8.8 on , PT15.7, INR 1.23, urine pregnancy negative with IUD for birth control, troponin neg x 1.   She had positive findings on abd [place] of Moderate cholelithiasis without sonographic evidence of acute cholecystitis. Findings suggesting distal choledocholithiasis with mild dilatation of the common bowel duct measuring 1 cm. The MRCP showed: Cholelithiasis, without radiographic signs of acute cholecystitis. Mild biliary ductal dilatation, with multiple small stones in the distal common bile duct. 1.5 cm probably benign lesion in the posterior right hepatic lobe. Continued follow up by abdomen MRI without and with Eovist contrast recommended in 6 months.   Currently, she has minimal pain and points to low mid sternal area still uncomfortable but the RUQ/epi region feels tight to  her. No N/V. Not hungry. Last ate half a [place] sandwich at 2300 last night. She took her meds this am- with sip of water. NPO.   No GERD history. No gallbladder Hx, No recent  pregnancy. Positive NSAID use for migraines. She has a migraine now right head and is using an ice pack.     Past Medical History  Diagnosis Date  . Hypothyroidism   . Anxiety     History reviewed. No pertinent past surgical history.  Family History  Problem Relation Age of Onset  . Hyperlipidemia Mother      Social History  Substance Use Topics  . Smoking status: Never Smoker   . Smokeless tobacco: Never Used  . Alcohol Use: Yes    Prior to Admission medications   Medication Sig Start Date End Date Taking? Authorizing Provider  escitalopram (LEXAPRO) 10 MG tablet Take 10 mg by mouth daily.   Yes Historical Provider, MD  levothyroxine (SYNTHROID, LEVOTHROID) 200 MCG tablet Take 200 mcg by mouth daily before breakfast.   Yes Historical Provider, MD    Current Facility-Administered Medications  Medication Dose Route Frequency Provider Last Rate Last Dose  . acetaminophen (TYLENOL) tablet 650 mg  650 mg Oral Q6H PRN Alexis Hugelmeyer, DO   650 mg at 09/17/15 0841   Or  . acetaminophen (TYLENOL) suppository 650 mg  650 mg Rectal Q6H PRN Alexis Hugelmeyer, DO      . dextrose 5 %-0.9 % sodium chloride infusion   Intravenous Continuous Alexis Hugelmeyer, DO 100 mL/hr at 09/17/15 0209    . escitalopram (LEXAPRO) tablet 10 mg  10 mg Oral Daily Alexis Hugelmeyer, DO   10 mg at 09/17/15 0841  . levofloxacin (LEVAQUIN) IVPB 500 mg  500  mg Intravenous Once Scot Jun, MD      . levothyroxine (SYNTHROID, LEVOTHROID) tablet 200 mcg  200 mcg Oral QAC breakfast Alexis Hugelmeyer, DO   200 mcg at 09/17/15 0841  . ondansetron (ZOFRAN) tablet 4 mg  4 mg Oral Q6H PRN Alexis Hugelmeyer, DO       Or  . ondansetron (ZOFRAN) injection 4 mg  4 mg Intravenous Q6H PRN Alexis Hugelmeyer, DO        Allergies as of 09/16/2015  . (No Known Allergies)     Review of Systems:    A 12 system review was obtained and all negative except where noted in HPI.    Physical Exam:  Vital signs in last  24 hours: Temp:  [97.9 F (36.6 C)-98.6 F (37 C)] 98.6 F (37 C) (07/21 0418) Pulse Rate:  [53-83] 59 (07/21 0418) Resp:  [11-25] 16 (07/21 0418) BP: (93-113)/(54-77) 100/54 mmHg (07/21 0418) SpO2:  [91 %-100 %] 98 % (07/21 0418) Weight:  [77.111 kg (170 lb)-81.511 kg (179 lb 11.2 oz)] 81.511 kg (179 lb 11.2 oz) (07/21 0144) Last BM Date: 09/16/15  General:  Well-developed, well-nourished, NAD but looks fatigued.  Head:  Head without obvious abnormality, atraumatic, HA and ice pack for migraine Eyes:   Conjunctiva pink, sclera anicteric   ENT:   Mouth free of lesions, mucosa moist, tongue pink, no thrush noted, teeth and gums normal Neck:   Supple w/o thyromegaly or mass, trachea midline, no adenopathy  Lungs: Clear to auscultation bilaterally, respirations unlabored Heart:     Normal S1S2, no rubs, murmurs, gallops. Abdomen: Abd is soft, a little uncomfortable RUQ and low sternum, no HSM or masses, no distention,  Rectal: Deferred Lymph:  No cervical or supraclavicular adenopathy. Extremities:   No edema, cyanosis, or clubbing Skin  Skin color, texture, turgor normal, no rashes or lesions Neuro:  A&O x 3. CNII-XII intact, normal strength Psych:  Appropriate mood and affect.  Data Reviewed:  LAB RESULTS:  Recent Labs  09/16/15 1111 09/17/15 0520  WBC 11.8* 8.8  HGB 14.2 12.8  HCT 41.8 36.4  PLT 232 190   BMET  Recent Labs  09/16/15 1111 09/17/15 0520  NA 138 139  K 4.3 4.0  CL 106 111  CO2 25 26  GLUCOSE 112* 99  BUN 15 11  CREATININE 0.95 0.85  CALCIUM 9.7 8.2*   LFT  Recent Labs  09/17/15 0520  PROT 6.4*  ALBUMIN 3.6  AST 32  ALT 38  ALKPHOS 45  BILITOT 0.9   PT/INR  Recent Labs  09/17/15 0520  LABPROT 15.7*  INR 1.23    STUDIES: Mr Abd W/wo Cm/mrcp  09/16/2015  CLINICAL DATA:  Right upper quadrant pain for 2 days. Nausea. Cholelithiasis and biliary ductal dilatation seen on recent ultrasound. EXAM: MRI ABDOMEN WITHOUT AND WITH CONTRAST  (INCLUDING MRCP) TECHNIQUE: Multiplanar multisequence MR imaging of the abdomen was performed both before and after the administration of intravenous contrast. Heavily T2-weighted images of the biliary and pancreatic ducts were obtained, and three-dimensional MRCP images were rendered by post processing. CONTRAST:  15mL MULTIHANCE GADOBENATE DIMEGLUMINE 529 MG/ML IV SOLN COMPARISON:  Ultrasound on 09/16/2015 FINDINGS: Lower chest:  No acute findings. Hepatobiliary: 1.5 cm flash filling lesion is seen in the subcapsular posterior right hepatic lobe, segment 7, on arterial phase imaging, image 22/series 17. This shows T2 isointensity, and likely represents a benign etiology such as focal nodular hyperplasia or perfusion anomaly. No other liver lesions identified. Multiple  gallstones are seen without evidence of acute cholecystitis. Mild biliary ductal dilatation is seen with common bile duct measuring 9 mm. There are several filling defects seen in the distal common bile duct measuring up to 8 mm, consistent with choledocholithiasis. Pancreas: No mass, inflammatory changes, or other parenchymal abnormality identified. Spleen:  Within normal limits in size and appearance. Adrenals/Urinary Tract: No masses identified. No evidence of hydronephrosis. Mild left lower pole renal scarring noted. Stomach/Bowel: Visualized portions within the abdomen are unremarkable. Vascular/Lymphatic: No pathologically enlarged lymph nodes identified. No abdominal aortic aneurysm demonstrated. Other:  None. Musculoskeletal:  No suspicious bone lesions identified. IMPRESSION: Cholelithiasis, without radiographic signs of acute cholecystitis. Mild biliary ductal dilatation, with multiple small stones in the distal common bile duct. 1.5 cm probably benign lesion in the posterior right hepatic lobe. Continued followup by abdomen MRI without and with Eovist contrast recommended in 6 months. Electronically Signed   By: Myles RosenthalJohn  Stahl M.D.   On:  09/16/2015 20:35   [place] Abdomen Limited Ruq  09/16/2015  CLINICAL DATA:  Upper abdominal pain 3 days with vomiting. EXAM: US ABDOMEN LIMITED - RIGHT UPPER QUADRANT COMPARISON:  None. FINDINGS: Gallbladder: Moderate cholelithiasis with the largest stone measuring 1 cm. No gallbladder wall thickening or pericholecystic fluid. Negative sonographic Murphy's sign. Common bile duct: Diameter: 7 mm proximally and 10 mm distally. Possible cluster of small stones versus single 1 cm stone within the distal common bile duct. Liver: No focal lesion identified. Within normal limits in parenchymal echogenicity. IMPRESSION: Moderate cholelithiasis without sonographic evidence of acute cholecystitis. Findings suggesting distal choledocholithiasis with mild dilatation of the common bowel duct measuring 1 cm. Consider MRCP for further evaluation. Electronically Signed   By: Elberta Fortisaniel  Boyle M.D.   On: 09/16/2015 14:06     Assessment:  Chipper Herbllison J Wadas is a 40 y.o. with a history of hypothyroidism, migraines and anxiety presents with  cholelithiasis, without radiographic signs of acute cholecystitis. Mild biliary ductal dilatation, with multiple small stones in the distal common bile duct. 1.5 cm probably benign lesion in the posterior right hepatic lobe. Normal LFT, bilirubin, and soft, minimal tenderness to abdomen. She is on Levaquin.   Plan:  Urgent ERCP was ordered by Dr. Mechele CollinElliott and Dr. Servando SnareWohl will perform this study this morning. I reviewed the procedure with her.  Probable benign lesion in liver Continued followup by abdomen MRI without and with Eovist contrast recommended in 6 months.  Eventual cholecystectomy to be discussed   This case was discussed with Dr. Scot Junobert T. Elliott in collaboration of care. Thank you for the consultation.  These services provided by Amedeo KinsmanKimberly Gurleen Larrivee RN, MSN, ANP-BC under collaborative practice agreement with Scot Junobert T. Elliott, MD.  09/17/2015, 10:15 AM

## 2015-09-17 NOTE — Progress Notes (Addendum)
Called by nurse Dahlia ClientHannah regarding patient complaint of severe abdominal pain.  She is POD#0 s/p ERCP for cholelithiasis and choledocholithiasis and reporting 8-10/10 midepigastric and RUQ abdominal pain radiating through to back.  Pain began about 2 hours after eating dinner.   She has had gas and small bowel movement post-procedure.   Vital signs are stable.  Patient is tearful, sitting up, having difficulty lying flat.  Abdomen is soft with positive RUQ and midespigastric tenderness and guarding.  No rebound or rigidity.  Normal BS x 4 quadrants.   A/P: Severe abdominal pain following ERCP for cholelithiasis and choledocholithiasis - will obtain Stat CT abdomen without contrast, check lipase, amylase and order pain control.  (Discussed CT with on call radiologist).  Paged covering GI Dr. Mechele CollinElliott.  Will follow closely.      09/18/15 12:38AM Addendum:  CT Abd results:  IMPRESSION: 1. Postprocedural changes from recent ERCP. Mild persistent biliary dilatation with punctate densities in the common bile duct suggestive of residual or recurrent choledocholithiasis. Suggest correlation with intraoperative images from earlier ERCP. 2. Cholelithiasis.   Will follow up lipase/amylase and relay information to GI in AM.  Pain control, NPO, continue IVFs.

## 2015-09-17 NOTE — Clinical Social Work Note (Signed)
CSW consulted for "uninsured". [facility] who will follow up with resources. CSW is signing off as no further needs identified.   Dede QuerySarah Daiwik Buffalo, MSW, LCSW  Clinical Social Worker  [phone]

## 2015-09-17 NOTE — ED Notes (Signed)
Pt asleep, notified admitting MD of previous patient complaint of headache.  Pt sleeping soundly at this time.  Admitting MD to go in an assess patient.  Will let this RN know if pt still needs pain medication after assessment.

## 2015-09-17 NOTE — H&P (Signed)
Rachel Hurst is an 40 y.o. female.   Chief Complaint:  HPI:   Past Medical History  Diagnosis Date  . Hypothyroidism   . Anxiety     No past surgical history on file.  Family History  Problem Relation Age of Onset  . Hyperlipidemia Mother    Social History:  reports that she has never smoked. She has never used smokeless tobacco. She reports that she drinks alcohol. Her drug history is not on file.  Allergies: No Known Allergies   (Not in a hospital admission)  Results for orders placed or performed during the hospital encounter of 09/16/15 (from the past 48 hour(s))  Lipase, blood     Status: None   Collection Time: 09/16/15 11:11 AM  Result Value Ref Range   Lipase 30 11 - 51 U/L  Comprehensive metabolic panel     Status: Abnormal   Collection Time: 09/16/15 11:11 AM  Result Value Ref Range   Sodium 138 135 - 145 mmol/L   Potassium 4.3 3.5 - 5.1 mmol/L   Chloride 106 101 - 111 mmol/L   CO2 25 22 - 32 mmol/L   Glucose, Bld 112 (H) 65 - 99 mg/dL   BUN 15 6 - 20 mg/dL   Creatinine, Ser 0.95 0.44 - 1.00 mg/dL   Calcium 9.7 8.9 - 10.3 mg/dL   Total Protein 7.8 6.5 - 8.1 g/dL   Albumin 4.4 3.5 - 5.0 g/dL   AST 26 15 - 41 U/L   ALT 16 14 - 54 U/L   Alkaline Phosphatase 45 38 - 126 U/L   Total Bilirubin 0.6 0.3 - 1.2 mg/dL   GFR calc non Af Amer >60 >60 mL/min   GFR calc Af Amer >60 >60 mL/min    Comment: (NOTE) The eGFR has been calculated using the CKD EPI equation. This calculation has not been validated in all clinical situations. eGFR's persistently <60 mL/min signify possible Chronic Kidney Disease.    Anion gap 7 5 - 15  CBC     Status: Abnormal   Collection Time: 09/16/15 11:11 AM  Result Value Ref Range   WBC 11.8 (H) 3.6 - 11.0 K/uL   RBC 4.87 3.80 - 5.20 MIL/uL   Hemoglobin 14.2 12.0 - 16.0 g/dL   HCT 41.8 35.0 - 47.0 %   MCV 86.0 80.0 - 100.0 fL   MCH 29.2 26.0 - 34.0 pg   MCHC 34.0 32.0 - 36.0 g/dL   RDW 13.8 11.5 - 14.5 %   Platelets 232  150 - 440 K/uL  Urinalysis complete, with microscopic     Status: Abnormal   Collection Time: 09/16/15 11:11 AM  Result Value Ref Range   Color, Urine YELLOW (A) YELLOW   APPearance CLEAR (A) CLEAR   Glucose, UA NEGATIVE NEGATIVE mg/dL   Bilirubin Urine NEGATIVE NEGATIVE   Ketones, ur NEGATIVE NEGATIVE mg/dL   Specific Gravity, Urine 1.024 1.005 - 1.030   Hgb urine dipstick NEGATIVE NEGATIVE   pH 6.0 5.0 - 8.0   Protein, ur NEGATIVE NEGATIVE mg/dL   Nitrite NEGATIVE NEGATIVE   Leukocytes, UA 1+ (A) NEGATIVE   RBC / HPF 6-30 0 - 5 RBC/hpf   WBC, UA 0-5 0 - 5 WBC/hpf   Bacteria, UA RARE (A) NONE SEEN   Squamous Epithelial / LPF 0-5 (A) NONE SEEN   Mucous PRESENT   Troponin I     Status: None   Collection Time: 09/16/15 11:11 AM  Result Value Ref Range  Troponin I <0.03 <0.03 ng/mL  Pregnancy, urine POC     Status: None   Collection Time: 09/16/15 11:18 AM  Result Value Ref Range   Preg Test, Ur NEGATIVE NEGATIVE    Comment:        THE SENSITIVITY OF THIS METHODOLOGY IS >24 mIU/mL   Comprehensive metabolic panel     Status: Abnormal   Collection Time: 09/17/15  5:20 AM  Result Value Ref Range   Sodium 139 135 - 145 mmol/L    Comment: LYTES REPEATED   Potassium 4.0 3.5 - 5.1 mmol/L   Chloride 111 101 - 111 mmol/L   CO2 26 22 - 32 mmol/L   Glucose, Bld 99 65 - 99 mg/dL   BUN 11 6 - 20 mg/dL   Creatinine, Ser 0.85 0.44 - 1.00 mg/dL   Calcium 8.2 (L) 8.9 - 10.3 mg/dL   Total Protein 6.4 (L) 6.5 - 8.1 g/dL   Albumin 3.6 3.5 - 5.0 g/dL   AST 32 15 - 41 U/L   ALT 38 14 - 54 U/L   Alkaline Phosphatase 45 38 - 126 U/L   Total Bilirubin 0.9 0.3 - 1.2 mg/dL   GFR calc non Af Amer >60 >60 mL/min   GFR calc Af Amer >60 >60 mL/min    Comment: (NOTE) The eGFR has been calculated using the CKD EPI equation. This calculation has not been validated in all clinical situations. eGFR's persistently <60 mL/min signify possible Chronic Kidney Disease.    Anion gap 2 (L) 5 - 15  CBC      Status: None   Collection Time: 09/17/15  5:20 AM  Result Value Ref Range   WBC 8.8 3.6 - 11.0 K/uL   RBC 4.22 3.80 - 5.20 MIL/uL   Hemoglobin 12.8 12.0 - 16.0 g/dL   HCT 36.4 35.0 - 47.0 %   MCV 86.2 80.0 - 100.0 fL   MCH 30.4 26.0 - 34.0 pg   MCHC 35.2 32.0 - 36.0 g/dL   RDW 14.0 11.5 - 14.5 %   Platelets 190 150 - 440 K/uL  Protime-INR     Status: Abnormal   Collection Time: 09/17/15  5:20 AM  Result Value Ref Range   Prothrombin Time 15.7 (H) 11.4 - 15.0 seconds   INR 1.23   APTT     Status: None   Collection Time: 09/17/15  5:20 AM  Result Value Ref Range   aPTT 30 24 - 36 seconds   Mr Abd W/wo Cm/mrcp  09/16/2015  CLINICAL DATA:  Right upper quadrant pain for 2 days. Nausea. Cholelithiasis and biliary ductal dilatation seen on recent ultrasound. EXAM: MRI ABDOMEN WITHOUT AND WITH CONTRAST (INCLUDING MRCP) TECHNIQUE: Multiplanar multisequence MR imaging of the abdomen was performed both before and after the administration of intravenous contrast. Heavily T2-weighted images of the biliary and pancreatic ducts were obtained, and three-dimensional MRCP images were rendered by post processing. CONTRAST:  71m MULTIHANCE GADOBENATE DIMEGLUMINE 529 MG/ML IV SOLN COMPARISON:  Ultrasound on 09/16/2015 FINDINGS: Lower chest:  No acute findings. Hepatobiliary: 1.5 cm flash filling lesion is seen in the subcapsular posterior right hepatic lobe, segment 7, on arterial phase imaging, image 22/series 17. This shows T2 isointensity, and likely represents a benign etiology such as focal nodular hyperplasia or perfusion anomaly. No other liver lesions identified. Multiple gallstones are seen without evidence of acute cholecystitis. Mild biliary ductal dilatation is seen with common bile duct measuring 9 mm. There are several filling defects seen in the  distal common bile duct measuring up to 8 mm, consistent with choledocholithiasis. Pancreas: No mass, inflammatory changes, or other parenchymal  abnormality identified. Spleen:  Within normal limits in size and appearance. Adrenals/Urinary Tract: No masses identified. No evidence of hydronephrosis. Mild left lower pole renal scarring noted. Stomach/Bowel: Visualized portions within the abdomen are unremarkable. Vascular/Lymphatic: No pathologically enlarged lymph nodes identified. No abdominal aortic aneurysm demonstrated. Other:  None. Musculoskeletal:  No suspicious bone lesions identified. IMPRESSION: Cholelithiasis, without radiographic signs of acute cholecystitis. Mild biliary ductal dilatation, with multiple small stones in the distal common bile duct. 1.5 cm probably benign lesion in the posterior right hepatic lobe. Continued followup by abdomen MRI without and with Eovist contrast recommended in 6 months. Electronically Signed   By: Earle Gell M.D.   On: 09/16/2015 20:35   US Abdomen Limited Ruq  09/16/2015  CLINICAL DATA:  Upper abdominal pain 3 days with vomiting. EXAM: US ABDOMEN LIMITED - RIGHT UPPER QUADRANT COMPARISON:  None. FINDINGS: Gallbladder: Moderate cholelithiasis with the largest stone measuring 1 cm. No gallbladder wall thickening or pericholecystic fluid. Negative sonographic Murphy's sign. Common bile duct: Diameter: 7 mm proximally and 10 mm distally. Possible cluster of small stones versus single 1 cm stone within the distal common bile duct. Liver: No focal lesion identified. Within normal limits in parenchymal echogenicity. IMPRESSION: Moderate cholelithiasis without sonographic evidence of acute cholecystitis. Findings suggesting distal choledocholithiasis with mild dilatation of the common bowel duct measuring 1 cm. Consider MRCP for further evaluation. Electronically Signed   By: Marin Olp M.D.   On: 09/16/2015 14:06    ROS  There were no vitals taken for this visit. Physical Exam   Assessment/Plan OPENED IN ERROR  Anthony Roland, CMA 09/17/2015, 2:11 PM

## 2015-09-17 NOTE — H&P (Signed)
Rachel Hurst is an 40 y.o. female.   Chief Complaint: RUQ abdominal pain HPI: This is a 40 year old white female with history of hypothyroidism, migraines and anxiety who presents with two day history of abdominal pain.  She reports right upper quadrant and mid-epigastric abdominal pain beginning two days ago associated with one episode of vomiting.  Pain worse with food intake.  Pain recurred today which prompted ED visit.  She denied fevers, chills, constipation, diarrhea.  No recent travel, sick contacts, changes in diet or medications.    Past Medical History  Diagnosis Date  . Hypothyroidism   . Anxiety   - Migraine  History reviewed. No pertinent past surgical history.  Family History  Problem Relation Age of Onset  . Hyperlipidemia Mother    Social History:  reports that she has never smoked. She has never used smokeless tobacco. She reports that she drinks alcohol. Her drug history is not on file. Social EtOH.   Allergies: No Known Allergies  Medications: reviewed. Lexapro, Levothyroxine and an Naproxen + intranasal triptan PRN migraine.   (Not in a hospital admission)  Results for orders placed or performed during the hospital encounter of 09/16/15 (from the past 48 hour(s))  Lipase, blood     Status: None   Collection Time: 09/16/15 11:11 AM  Result Value Ref Range   Lipase 30 11 - 51 U/L  Comprehensive metabolic panel     Status: Abnormal   Collection Time: 09/16/15 11:11 AM  Result Value Ref Range   Sodium 138 135 - 145 mmol/L   Potassium 4.3 3.5 - 5.1 mmol/L   Chloride 106 101 - 111 mmol/L   CO2 25 22 - 32 mmol/L   Glucose, Bld 112 (H) 65 - 99 mg/dL   BUN 15 6 - 20 mg/dL   Creatinine, Ser 0.95 0.44 - 1.00 mg/dL   Calcium 9.7 8.9 - 10.3 mg/dL   Total Protein 7.8 6.5 - 8.1 g/dL   Albumin 4.4 3.5 - 5.0 g/dL   AST 26 15 - 41 U/L   ALT 16 14 - 54 U/L   Alkaline Phosphatase 45 38 - 126 U/L   Total Bilirubin 0.6 0.3 - 1.2 mg/dL   GFR calc non Af Amer >60 >60  mL/min   GFR calc Af Amer >60 >60 mL/min    Comment: (NOTE) The eGFR has been calculated using the CKD EPI equation. This calculation has not been validated in all clinical situations. eGFR's persistently <60 mL/min signify possible Chronic Kidney Disease.    Anion gap 7 5 - 15  CBC     Status: Abnormal   Collection Time: 09/16/15 11:11 AM  Result Value Ref Range   WBC 11.8 (H) 3.6 - 11.0 K/uL   RBC 4.87 3.80 - 5.20 MIL/uL   Hemoglobin 14.2 12.0 - 16.0 g/dL   HCT 41.8 35.0 - 47.0 %   MCV 86.0 80.0 - 100.0 fL   MCH 29.2 26.0 - 34.0 pg   MCHC 34.0 32.0 - 36.0 g/dL   RDW 13.8 11.5 - 14.5 %   Platelets 232 150 - 440 K/uL  Urinalysis complete, with microscopic     Status: Abnormal   Collection Time: 09/16/15 11:11 AM  Result Value Ref Range   Color, Urine YELLOW (A) YELLOW   APPearance CLEAR (A) CLEAR   Glucose, UA NEGATIVE NEGATIVE mg/dL   Bilirubin Urine NEGATIVE NEGATIVE   Ketones, ur NEGATIVE NEGATIVE mg/dL   Specific Gravity, Urine 1.024 1.005 - 1.030  Hgb urine dipstick NEGATIVE NEGATIVE   pH 6.0 5.0 - 8.0   Protein, ur NEGATIVE NEGATIVE mg/dL   Nitrite NEGATIVE NEGATIVE   Leukocytes, UA 1+ (A) NEGATIVE   RBC / HPF 6-30 0 - 5 RBC/hpf   WBC, UA 0-5 0 - 5 WBC/hpf   Bacteria, UA RARE (A) NONE SEEN   Squamous Epithelial / LPF 0-5 (A) NONE SEEN   Mucous PRESENT   Troponin I     Status: None   Collection Time: 09/16/15 11:11 AM  Result Value Ref Range   Troponin I <0.03 <0.03 ng/mL  Pregnancy, urine POC     Status: None   Collection Time: 09/16/15 11:18 AM  Result Value Ref Range   Preg Test, Ur NEGATIVE NEGATIVE    Comment:        THE SENSITIVITY OF THIS METHODOLOGY IS >24 mIU/mL    Mr Abd W/wo Cm/mrcp  09/16/2015  CLINICAL DATA:  Right upper quadrant pain for 2 days. Nausea. Cholelithiasis and biliary ductal dilatation seen on recent ultrasound. EXAM: MRI ABDOMEN WITHOUT AND WITH CONTRAST (INCLUDING MRCP) TECHNIQUE: Multiplanar multisequence MR imaging of the  abdomen was performed both before and after the administration of intravenous contrast. Heavily T2-weighted images of the biliary and pancreatic ducts were obtained, and three-dimensional MRCP images were rendered by post processing. CONTRAST:  57m MULTIHANCE GADOBENATE DIMEGLUMINE 529 MG/ML IV SOLN COMPARISON:  Ultrasound on 09/16/2015 FINDINGS: Lower chest:  No acute findings. Hepatobiliary: 1.5 cm flash filling lesion is seen in the subcapsular posterior right hepatic lobe, segment 7, on arterial phase imaging, image 22/series 17. This shows T2 isointensity, and likely represents a benign etiology such as focal nodular hyperplasia or perfusion anomaly. No other liver lesions identified. Multiple gallstones are seen without evidence of acute cholecystitis. Mild biliary ductal dilatation is seen with common bile duct measuring 9 mm. There are several filling defects seen in the distal common bile duct measuring up to 8 mm, consistent with choledocholithiasis. Pancreas: No mass, inflammatory changes, or other parenchymal abnormality identified. Spleen:  Within normal limits in size and appearance. Adrenals/Urinary Tract: No masses identified. No evidence of hydronephrosis. Mild left lower pole renal scarring noted. Stomach/Bowel: Visualized portions within the abdomen are unremarkable. Vascular/Lymphatic: No pathologically enlarged lymph nodes identified. No abdominal aortic aneurysm demonstrated. Other:  None. Musculoskeletal:  No suspicious bone lesions identified. IMPRESSION: Cholelithiasis, without radiographic signs of acute cholecystitis. Mild biliary ductal dilatation, with multiple small stones in the distal common bile duct. 1.5 cm probably benign lesion in the posterior right hepatic lobe. Continued followup by abdomen MRI without and with Eovist contrast recommended in 6 months. Electronically Signed   By: JEarle GellM.D.   On: 09/16/2015 20:35   [place] Limited Ruq  09/16/2015  CLINICAL DATA:   Upper abdominal pain 3 days with vomiting. EXAM: [place] LIMITED - RIGHT UPPER QUADRANT COMPARISON:  None. FINDINGS: Gallbladder: Moderate cholelithiasis with the largest stone measuring 1 cm. No gallbladder wall thickening or pericholecystic fluid. Negative sonographic Murphy's sign. Common bile duct: Diameter: 7 mm proximally and 10 mm distally. Possible cluster of small stones versus single 1 cm stone within the distal common bile duct. Liver: No focal lesion identified. Within normal limits in parenchymal echogenicity. IMPRESSION: Moderate cholelithiasis without sonographic evidence of acute cholecystitis. Findings suggesting distal choledocholithiasis with mild dilatation of the common bowel duct measuring 1 cm. Consider MRCP for further evaluation. Electronically Signed   By: DMarin OlpM.D.   On: 09/16/2015 14:06  Review of Systems  Constitutional: Negative.  Negative for fever and chills.  Eyes: Negative.  Negative for blurred vision, double vision and photophobia.  Respiratory: Negative for cough, sputum production and shortness of breath.   Cardiovascular: Negative.   Gastrointestinal: Positive for nausea, vomiting and abdominal pain. Negative for diarrhea and constipation.  Genitourinary: Negative.  Negative for dysuria, urgency and flank pain.  Musculoskeletal: Negative.  Negative for back pain and joint pain.  Skin: Negative.   Neurological: Positive for headaches. Negative for dizziness.  Endo/Heme/Allergies: Negative.   Psychiatric/Behavioral: Negative.   All other systems reviewed and are negative.   Blood pressure 104/70, pulse 66, temperature 97.9 F (36.6 C), temperature source Oral, resp. rate 25, height 5' 4"  (1.626 m), weight 77.111 kg (170 lb), SpO2 97 %. Physical Exam   GENERAL: WDWN, Pleasant. well-appearing in no apparent distress.  HEAD, EYES, EARS, NOSE AND THROAT: Atraumatic, normocephalic. Extraocular muscles are intact. Pupils equal and reactive to light.  Sclerae anicteric. No conjunctival injection. No oro-pharyngeal erythema.  NECK: Supple. There is no jugular venous distention. No bruits, no lymphadenopathy, no thyromegaly.  HEART: Regular rate and rhythm, tachycardic. No murmurs, no rubs, no clicks.  LUNGS: Clear to auscultation bilaterally. No rales or rhonchi. No wheezes.  ABDOMEN: Soft, flat, nondistended. Positive RUQ tenderness to palpation with Murphy's sign.  Has good bowel sounds. No hepatosplenomegaly appreciated.  EXTREMITIES: No evidence of any cyanosis, clubbing, or peripheral edema.  +2 pedal and radial pulses bilaterally.  NEUROLOGIC: The patient is alert, awake, and oriented x3 with no focal motor or sensory deficits appreciated bilaterally.  SKIN: Moist and warm with no rashes appreciated.  LYMPHATIC: No cervical or axillary lymphadenopathy.   Assessment/Plan This is a 40 year old white female with history of anxiety, hypothyroidism and migraines who presents with two days of RUQ abdominal pain and now being admitted for: 1. Biliary colic - cholelithiasis, choledocholithiasis without evidence of acute cholecystitis.  MRCP, labs reviewed and plan discussed with ED physician who consulted [facility].  Recommendation is admission for inpatient GI consultation for consideration of ERCP.  NPO after midnight, pain control, antiemetics, IV fluid hydration.  Incidental posterior liver lesion identified will require outpatient follow up. 2. Anxiety - continue Lexapro. 3. Migraine headache - continue Naproxen which has helped her in the past.   4. Routine DVT prophylaxis with SCDs and early ambulation given low VTE risk.  Plan of care has been discussed with the patient who expresses understanding of her condition and agrees with plan of care.    [employer], DO 09/17/2015, 12:43 AM

## 2015-09-17 NOTE — Anesthesia Postprocedure Evaluation (Signed)
Anesthesia Post Note  Patient: Chipper Herbllison J Calixto  Procedure(s) Performed: Procedure(s) (LRB): ENDOSCOPIC RETROGRADE CHOLANGIOPANCREATOGRAPHY (ERCP) (N/A)  Patient location during evaluation: PACU Anesthesia Type: General Level of consciousness: awake Pain management: pain level controlled Vital Signs Assessment: post-procedure vital signs reviewed and stable Respiratory status: spontaneous breathing Cardiovascular status: blood pressure returned to baseline Anesthetic complications: no    Last Vitals:  Filed Vitals:   09/17/15 1718 09/17/15 1728  BP: 107/65   Pulse: 75 63  Temp: 36.8 C   Resp: 13 7    Last Pain:  Filed Vitals:   09/17/15 1728  PainSc: 7                  VAN STAVEREN,Brison Fiumara

## 2015-09-17 NOTE — Op Note (Signed)
[facility] Gastroenterology Patient Name: Rachel Hurst Procedure Date: 09/17/2015 4:24 PM MRN: [id number] Account #: [id number] Date of Birth: 03-02-1975 Admit Type: Inpatient Age: 40 Room: [facility] ENDO ROOM 4 Gender: Female Note Status: Finalized Procedure:            ERCP Indications:          Bile duct stone(s) Providers:            Midge Minium MD, MD Referring MD:         Oleh Genin. Doss (Referring MD) Medicines:            Propofol per Anesthesia Complications:        No immediate complications. Procedure:            Pre-Anesthesia Assessment:                       - Prior to the procedure, a History and Physical was                        performed, and patient medications and allergies were                        reviewed. The patient's tolerance of previous                        anesthesia was also reviewed. The risks and benefits of                        the procedure and the sedation options and risks were                        discussed with the patient. All questions were                        answered, and informed consent was obtained. Prior                        Anticoagulants: The patient has taken no previous                        anticoagulant or antiplatelet agents. ASA Grade                        Assessment: II - A patient with mild systemic disease.                        After reviewing the risks and benefits, the patient was                        deemed in satisfactory condition to undergo the                        procedure.                       After obtaining informed consent, the scope was passed                        under direct vision. Throughout the procedure, the  patient's blood pressure, pulse, and oxygen saturations                        were monitored continuously. The Endosonoscope was                        introduced through the mouth, and used to inject                        contrast into  and used to inject contrast into the bile                        duct. The ERCP was accomplished without difficulty. The                        patient tolerated the procedure well. Findings:      The scout film was normal. The major papilla was normal. The bile duct       was deeply cannulated. Contrast was injected. I personally interpreted       the bile duct images. There was brisk flow of contrast through the       ducts. Image quality was excellent. Contrast extended to the hepatic       ducts. The lower third of the main bile duct contained multiple stones       mm. A wire was passed into the biliary tree. An 8 mm biliary       sphincterotomy was made with a sphincterotome using ERBE electrocautery.       There was no post-sphincterotomy bleeding. The biliary tree was swept       with a 15 mm balloon starting at the bifurcation. Sludge was swept from       the duct. All stones were removed. The biliary tree was swept with a       basket starting at the bifurcation. Sludge was swept from the duct. All       stones were removed. Impression:           - The major papilla appeared normal.                       - Choledocholithiasis was found. Complete removal was                        accomplished by biliary sphincterotomy and basket                        extraction.                       - A biliary sphincterotomy was performed.                       - The biliary tree was swept.                       - The biliary tree was swept. Recommendation:       - Watch for pancreatitis, bleeding, perforation, and                        cholangitis. Procedure Code(s):    --- Professional ---  [phone], Endoscopic retrograde cholangiopancreatography                        (ERCP); with removal of calculi/debris from                        biliary/pancreatic duct(s)                       43262, Endoscopic retrograde cholangiopancreatography                        (ERCP); with  sphincterotomy/papillotomy                       [id number], Endoscopic catheterization of the biliary ductal                        system, radiological supervision and interpretation Diagnosis Code(s):    --- Professional ---                       K80.50, Calculus of bile duct without cholangitis or                        cholecystitis without obstruction CPT copyright 2016 [employer]. All rights reserved. The codes documented in this report are preliminary and upon coder review may  be revised to meet current compliance requirements. Midge Minium MD, MD 09/17/2015 5:10:13 PM This report has been signed electronically. Number of Addenda: 0 Note Initiated On: 09/17/2015 4:24 PM      [facility]

## 2015-09-17 NOTE — Transfer of Care (Signed)
Immediate Anesthesia Transfer of Care Note  Patient: Rachel Hurst  Procedure(s) Performed: Procedure(s): ENDOSCOPIC RETROGRADE CHOLANGIOPANCREATOGRAPHY (ERCP) (N/A)  Patient Location: PACU  Anesthesia Type:General  Level of Consciousness: sedated  Airway & Oxygen Therapy: Patient Spontanous Breathing and Patient connected to nasal cannula oxygen  Post-op Assessment: Report given to RN  Post vital signs: Reviewed and stable  Last Vitals:  Filed Vitals:   09/17/15 1602 09/17/15 1718  BP: 111/69 107/65  Pulse: 77 75  Temp: 37.1 C 36.8 C  Resp: 16 13    Last Pain:  Filed Vitals:   09/17/15 1718  PainSc: 7          Complications: No apparent anesthesia complications

## 2015-09-18 ENCOUNTER — Encounter
Admission: EM | Disposition: A | Discharge status: Home / Self Care | Payer: Self-pay | Source: Home / Self Care | Attending: Internal Medicine

## 2015-09-18 ENCOUNTER — Inpatient Hospital Stay: Payer: MEDICAID | Admitting: Anesthesiology

## 2015-09-18 ENCOUNTER — Encounter: Payer: Self-pay | Admitting: Anesthesiology

## 2015-09-18 ENCOUNTER — Inpatient Hospital Stay: Payer: MEDICAID

## 2015-09-18 DIAGNOSIS — K8010 Calculus of gallbladder with chronic cholecystitis without obstruction: Secondary | ICD-10-CM

## 2015-09-18 DIAGNOSIS — K8020 Calculus of gallbladder without cholecystitis without obstruction: Secondary | ICD-10-CM | POA: Insufficient documentation

## 2015-09-18 DIAGNOSIS — K8001 Calculus of gallbladder with acute cholecystitis with obstruction: Secondary | ICD-10-CM

## 2015-09-18 HISTORY — PX: CHOLECYSTECTOMY: SHX55

## 2015-09-18 LAB — COMPREHENSIVE METABOLIC PANEL
ALK PHOS: 47 U/L (ref 38–126)
ALT: 64 U/L — ABNORMAL HIGH (ref 14–54)
ANION GAP: 5 (ref 5–15)
AST: 81 U/L — ABNORMAL HIGH (ref 15–41)
Albumin: 3.4 g/dL — ABNORMAL LOW (ref 3.5–5.0)
BILIRUBIN TOTAL: 1 mg/dL (ref 0.3–1.2)
BUN: 6 mg/dL (ref 6–20)
CALCIUM: 8.1 mg/dL — AB (ref 8.9–10.3)
CO2: 22 mmol/L (ref 22–32)
Chloride: 110 mmol/L (ref 101–111)
Creatinine, Ser: 0.65 mg/dL (ref 0.44–1.00)
GFR calc non Af Amer: >60 mL/min (ref >60)
Glucose, Bld: 100 mg/dL — ABNORMAL HIGH (ref 65–99)
Potassium: 3.6 mmol/L (ref 3.5–5.1)
Sodium: 137 mmol/L (ref 135–145)
TOTAL PROTEIN: 6.2 g/dL — AB (ref 6.5–8.1)

## 2015-09-18 LAB — CBC
HEMATOCRIT: 36.2 % (ref 35.0–47.0)
HEMOGLOBIN: 12.3 g/dL (ref 12.0–16.0)
MCH: 29.1 pg (ref 26.0–34.0)
MCHC: 34 g/dL (ref 32.0–36.0)
MCV: 85.8 fL (ref 80.0–100.0)
Platelets: 188 10*3/uL (ref 150–440)
RBC: 4.22 MIL/uL (ref 3.80–5.20)
RDW: 13.3 % (ref 11.5–14.5)
WBC: 13 10*3/uL — ABNORMAL HIGH (ref 3.6–11.0)

## 2015-09-18 LAB — LIPASE, BLOOD: Lipase: 31 U/L (ref 11–51)

## 2015-09-18 LAB — AMYLASE: Amylase: 91 U/L (ref 28–100)

## 2015-09-18 SURGERY — LAPAROSCOPIC CHOLECYSTECTOMY WITH INTRAOPERATIVE CHOLANGIOGRAM
Anesthesia: General | Wound class: Clean Contaminated

## 2015-09-18 MED ORDER — GLYCOPYRROLATE 0.2 MG/ML IJ SOLN
INTRAMUSCULAR | Status: DC | PRN
  Administered 2015-09-18: 0.2 mg via INTRAVENOUS

## 2015-09-18 MED ORDER — ROCURONIUM BROMIDE 100 MG/10ML IV SOLN
INTRAVENOUS | Status: DC | PRN
  Administered 2015-09-18 (×2): 10 mg via INTRAVENOUS
  Administered 2015-09-18 (×2): 20 mg via INTRAVENOUS

## 2015-09-18 MED ORDER — ACETAMINOPHEN 10 MG/ML IV SOLN
INTRAVENOUS | Status: DC | PRN
  Administered 2015-09-18: 1000 mg via INTRAVENOUS

## 2015-09-18 MED ORDER — OXYCODONE HCL 5 MG PO TABS
5.0000 mg | ORAL_TABLET | ORAL | Status: DC | PRN
  Administered 2015-09-18 – 2015-09-20 (×4): 5 mg via ORAL
  Filled 2015-09-18 (×4): qty 1

## 2015-09-18 MED ORDER — BUPIVACAINE-EPINEPHRINE (PF) 0.25% -1:200000 IJ SOLN
INTRAMUSCULAR | Status: AC
  Filled 2015-09-18: qty 30

## 2015-09-18 MED ORDER — BUPIVACAINE-EPINEPHRINE 0.25% -1:200000 IJ SOLN
INTRAMUSCULAR | Status: DC | PRN
  Administered 2015-09-18: 30 mL

## 2015-09-18 MED ORDER — PROPOFOL 10 MG/ML IV BOLUS
INTRAVENOUS | Status: DC | PRN
  Administered 2015-09-18: 150 mg via INTRAVENOUS
  Administered 2015-09-18: 50 mg via INTRAVENOUS

## 2015-09-18 MED ORDER — GLUCAGON HCL RDNA (DIAGNOSTIC) 1 MG IJ SOLR
INTRAMUSCULAR | Status: AC
  Filled 2015-09-18: qty 1

## 2015-09-18 MED ORDER — FENTANYL CITRATE (PF) 100 MCG/2ML IJ SOLN
25.0000 ug | INTRAMUSCULAR | Status: DC | PRN
  Administered 2015-09-18 (×2): 25 ug via INTRAVENOUS

## 2015-09-18 MED ORDER — DEXAMETHASONE SODIUM PHOSPHATE 10 MG/ML IJ SOLN
INTRAMUSCULAR | Status: DC | PRN
  Administered 2015-09-18: 10 mg via INTRAVENOUS

## 2015-09-18 MED ORDER — SODIUM CHLORIDE 0.9 % IV SOLN
INTRAVENOUS | Status: DC
  Administered 2015-09-18 – 2015-09-19 (×4): via INTRAVENOUS

## 2015-09-18 MED ORDER — DEXTROSE 5 % IV SOLN
2.0000 g | INTRAVENOUS | Status: AC
  Filled 2015-09-18: qty 2

## 2015-09-18 MED ORDER — GLUCAGON HCL RDNA (DIAGNOSTIC) 1 MG IJ SOLR
INTRAMUSCULAR | Status: DC | PRN
  Administered 2015-09-18: 1 mg via INTRAVENOUS

## 2015-09-18 MED ORDER — KETOROLAC TROMETHAMINE 30 MG/ML IJ SOLN
INTRAMUSCULAR | Status: DC | PRN
  Administered 2015-09-18: 30 mg via INTRAVENOUS

## 2015-09-18 MED ORDER — SUGAMMADEX SODIUM 200 MG/2ML IV SOLN
INTRAVENOUS | Status: DC | PRN
  Administered 2015-09-18: 165 mg via INTRAVENOUS

## 2015-09-18 MED ORDER — CEFAZOLIN SODIUM-DEXTROSE 2-3 GM-% IV SOLR
INTRAVENOUS | Status: DC | PRN
Start: 2015-09-18 — End: 2015-09-18
  Administered 2015-09-18: 2 g via INTRAVENOUS

## 2015-09-18 MED ORDER — MORPHINE SULFATE (PF) 2 MG/ML IV SOLN: 2.0000 mg | INTRAVENOUS | Status: DC | PRN

## 2015-09-18 MED ORDER — SODIUM CHLORIDE 0.9 % IJ SOLN
INTRAMUSCULAR | Status: AC
  Filled 2015-09-18: qty 50

## 2015-09-18 MED ORDER — ACETAMINOPHEN 10 MG/ML IV SOLN
INTRAVENOUS | Status: AC
  Filled 2015-09-18: qty 100

## 2015-09-18 MED ORDER — ONDANSETRON HCL 4 MG/2ML IJ SOLN: 4.0000 mg | Freq: Once | INTRAMUSCULAR | Status: DC | PRN

## 2015-09-18 MED ORDER — HYDROMORPHONE HCL 1 MG/ML IJ SOLN
0.5000 mg | INTRAMUSCULAR | Status: DC | PRN
  Administered 2015-09-18 – 2015-09-19 (×3): 0.5 mg via INTRAVENOUS
  Filled 2015-09-18 (×3): qty 1

## 2015-09-18 MED ORDER — LIDOCAINE HCL (CARDIAC) 20 MG/ML IV SOLN
INTRAVENOUS | Status: DC | PRN
  Administered 2015-09-18: 50 mg via INTRAVENOUS

## 2015-09-18 MED ORDER — CHLORHEXIDINE GLUCONATE CLOTH 2 % EX PADS: 6.0000 | MEDICATED_PAD | Freq: Once | CUTANEOUS | Status: DC

## 2015-09-18 MED ORDER — FENTANYL CITRATE (PF) 100 MCG/2ML IJ SOLN
INTRAMUSCULAR | Status: DC | PRN
  Administered 2015-09-18 (×2): 50 ug via INTRAVENOUS

## 2015-09-18 MED ORDER — FENTANYL CITRATE (PF) 100 MCG/2ML IJ SOLN
INTRAMUSCULAR | Status: AC
  Filled 2015-09-18: qty 2

## 2015-09-18 MED ORDER — SODIUM CHLORIDE 0.9 % IV SOLN
INTRAVENOUS | Status: DC | PRN
  Administered 2015-09-18: 11:00:00 via INTRAVENOUS

## 2015-09-18 MED ORDER — CHLORHEXIDINE GLUCONATE CLOTH 2 % EX PADS
6.0000 | MEDICATED_PAD | Freq: Once | CUTANEOUS | Status: AC
  Administered 2015-09-18: 11:00:00 6 via TOPICAL

## 2015-09-18 MED ORDER — ONDANSETRON HCL 4 MG/2ML IJ SOLN
INTRAMUSCULAR | Status: DC | PRN
  Administered 2015-09-18: 4 mg via INTRAVENOUS

## 2015-09-18 MED ORDER — IOPAMIDOL (ISOVUE-300) INJECTION 61%
INTRAVENOUS | Status: DC | PRN
Start: 2015-09-18 — End: 2015-09-18
  Administered 2015-09-18: 50 mL

## 2015-09-18 MED ORDER — MIDAZOLAM HCL 2 MG/2ML IJ SOLN
INTRAMUSCULAR | Status: DC | PRN
  Administered 2015-09-18: 1 mg via INTRAVENOUS

## 2015-09-18 MED ORDER — SUCCINYLCHOLINE CHLORIDE 20 MG/ML IJ SOLN
INTRAMUSCULAR | Status: DC | PRN
Start: 2015-09-18 — End: 2015-09-18
  Administered 2015-09-18: 110 mg via INTRAVENOUS

## 2015-09-18 MED ORDER — KETOROLAC TROMETHAMINE 30 MG/ML IJ SOLN
30.0000 mg | Freq: Four times a day (QID) | INTRAMUSCULAR | Status: DC
  Administered 2015-09-18 – 2015-09-20 (×8): 30 mg via INTRAVENOUS
  Filled 2015-09-18 (×8): qty 1

## 2015-09-18 SURGICAL SUPPLY — 46 items
APPLICATOR COTTON TIP 6IN STRL (MISCELLANEOUS) ×3 IMPLANT
APPLIER CLIP 5 13 M/L LIGAMAX5 (MISCELLANEOUS) ×3
BLADE SURG 15 STRL LF DISP TIS (BLADE) ×1 IMPLANT
BLADE SURG 15 STRL SS (BLADE) ×2
CANISTER SUCT 1200ML W/VALVE (MISCELLANEOUS) IMPLANT
CANISTER SUCT 3000ML (MISCELLANEOUS) ×3 IMPLANT
CATH FOGERTY 4X80 WAS (CATHETERS) ×3 IMPLANT
CHLORAPREP W/TINT 26ML (MISCELLANEOUS) ×3 IMPLANT
CHOLANGIOGRAM CATH TAUT (CATHETERS) IMPLANT
CLEANER CAUTERY TIP 5X5 PAD (MISCELLANEOUS) ×1 IMPLANT
CLIP APPLIE 5 13 M/L LIGAMAX5 (MISCELLANEOUS) ×1 IMPLANT
DECANTER SPIKE VIAL GLASS SM (MISCELLANEOUS) IMPLANT
DEVICE TROCAR PUNCTURE CLOSURE (ENDOMECHANICALS) IMPLANT
DRAPE C-ARM XRAY 36X54 (DRAPES) ×3 IMPLANT
ELECT REM PT RETURN 9FT ADLT (ELECTROSURGICAL) ×3
ELECTRODE REM PT RTRN 9FT ADLT (ELECTROSURGICAL) ×1 IMPLANT
ENDOPOUCH RETRIEVER 10 (MISCELLANEOUS) ×3 IMPLANT
GLOVE BIO SURGEON STRL SZ7 (GLOVE) ×9 IMPLANT
GOWN STRL REUS W/ TWL LRG LVL3 (GOWN DISPOSABLE) ×2 IMPLANT
GOWN STRL REUS W/TWL LRG LVL3 (GOWN DISPOSABLE) ×4
IRRIGATION STRYKERFLOW (MISCELLANEOUS) ×1 IMPLANT
IRRIGATOR STRYKERFLOW (MISCELLANEOUS) ×3
IV CATH ANGIO 12GX3 LT BLUE (NEEDLE) ×3 IMPLANT
IV SOD CHL 0.9% 1000ML (IV SOLUTION) ×3 IMPLANT
L-HOOK LAP DISP 36CM (ELECTROSURGICAL) ×3
LHOOK LAP DISP 36CM (ELECTROSURGICAL) ×1 IMPLANT
LIQUID BAND (GAUZE/BANDAGES/DRESSINGS) ×3 IMPLANT
NEEDLE HYPO 22GX1.5 SAFETY (NEEDLE) ×3 IMPLANT
PACK LAP CHOLECYSTECTOMY (MISCELLANEOUS) ×3 IMPLANT
PAD CLEANER CAUTERY TIP 5X5 (MISCELLANEOUS) ×2
PENCIL ELECTRO HAND CTR (MISCELLANEOUS) ×3 IMPLANT
SCISSORS METZENBAUM CVD 33 (INSTRUMENTS) ×3 IMPLANT
SET CHOLANGIOGRAPHY PERC (MISCELLANEOUS) ×3 IMPLANT
SLEEVE ENDOPATH XCEL 5M (ENDOMECHANICALS) ×6 IMPLANT
SOL ANTI-FOG 6CC FOG-OUT (MISCELLANEOUS) ×1 IMPLANT
SOL FOG-OUT ANTI-FOG 6CC (MISCELLANEOUS) ×2
STOPCOCK 3 WAY  REPLAC (MISCELLANEOUS) ×3 IMPLANT
SUT ETHIBOND 0 MO6 C/R (SUTURE) IMPLANT
SUT MNCRL AB 4-0 PS2 18 (SUTURE) ×3 IMPLANT
SUT VIC AB 0 CT2 27 (SUTURE) IMPLANT
SUT VICRYL 0 AB UR-6 (SUTURE) ×6 IMPLANT
SYR 20CC LL (SYRINGE) ×6 IMPLANT
TROCAR XCEL BLUNT TIP 100MML (ENDOMECHANICALS) ×3 IMPLANT
TROCAR XCEL NON-BLD 5MMX100MML (ENDOMECHANICALS) ×3 IMPLANT
TUBING INSUFFLATOR HI FLOW (MISCELLANEOUS) ×3 IMPLANT
WATER STERILE IRR 1000ML POUR (IV SOLUTION) IMPLANT

## 2015-09-18 NOTE — Transfer of Care (Signed)
Immediate Anesthesia Transfer of Care Note  Patient: Rachel Hurst  Procedure(s) Performed: Procedure(s): LAPAROSCOPIC CHOLECYSTECTOMY WITH INTRAOPERATIVE CHOLANGIOGRAM and exploration common bile duct (N/A)  Patient Location: PACU  Anesthesia Type:General  Level of Consciousness: sedated  Airway & Oxygen Therapy: Patient Spontanous Breathing and Patient connected to face mask oxygen  Post-op Assessment: Report given to RN and Post -op Vital signs reviewed and stable  Post vital signs: Reviewed and stable  Last Vitals:  Filed Vitals:   09/18/15 0453 09/18/15 1324  BP: 102/57 107/59  Pulse: 69 94  Temp: 36.9 C 36.8 C  Resp: 20 16    Last Pain:  Filed Vitals:   09/18/15 1325  PainSc: 3          Complications: No apparent anesthesia complications

## 2015-09-18 NOTE — Anesthesia Preprocedure Evaluation (Addendum)
Anesthesia Evaluation  Patient identified by MRN, date of birth, ID band Patient awake    Reviewed: Allergy & Precautions, NPO status , Patient's Chart, lab work & pertinent test results, reviewed documented beta blocker date and time   Airway Mallampati: II  TM Distance: >3 FB     Dental  (+) Chipped   Pulmonary           Cardiovascular      Neuro/Psych Anxiety    GI/Hepatic   Endo/Other  Hypothyroidism   Renal/GU      Musculoskeletal   Abdominal   Peds  Hematology   Anesthesia Other Findings U-preg negative. EKG reviewed and OK.  Reproductive/Obstetrics                            Anesthesia Physical Anesthesia Plan  ASA: II  Anesthesia Plan: General   Post-op Pain Management:    Induction: Intravenous  Airway Management Planned: Oral ETT  Additional Equipment:   Intra-op Plan:   Post-operative Plan:   Informed Consent: I have reviewed the patients History and Physical, chart, labs and discussed the procedure including the risks, benefits and alternatives for the proposed anesthesia with the patient or authorized representative who has indicated his/her understanding and acceptance.     Plan Discussed with: CRNA  Anesthesia Plan Comments:         Anesthesia Quick Evaluation

## 2015-09-18 NOTE — Anesthesia Procedure Notes (Signed)
Procedure Name: Intubation Date/Time: 09/18/2015 11:16 AM Performed by: Ginger Carne Pre-anesthesia Checklist: Patient identified, Emergency Drugs available, Suction available, Patient being monitored and Timeout performed Patient Re-evaluated:Patient Re-evaluated prior to inductionOxygen Delivery Method: Circle system utilized Preoxygenation: Pre-oxygenation with 100% oxygen Intubation Type: IV induction Ventilation: Mask ventilation without difficulty Laryngoscope Size: Miller and 2 Grade View: Grade I Tube type: Oral Tube size: 7.0 mm Airway Equipment and Method: Stylet Placement Confirmation: ETT inserted through vocal cords under direct vision,  positive ETCO2 and breath sounds checked- equal and bilateral Secured at: 21 cm Tube secured with: Tape Dental Injury: Teeth and Oropharynx as per pre-operative assessment

## 2015-09-18 NOTE — Anesthesia Postprocedure Evaluation (Signed)
Anesthesia Post Note  Patient: Rachel Hurst  Procedure(s) Performed: Procedure(s) (LRB): LAPAROSCOPIC CHOLECYSTECTOMY WITH INTRAOPERATIVE CHOLANGIOGRAM and exploration common bile duct (N/A)  Patient location during evaluation: PACU Anesthesia Type: General Level of consciousness: awake and alert Pain management: pain level controlled Vital Signs Assessment: post-procedure vital signs reviewed and stable Respiratory status: spontaneous breathing, nonlabored ventilation, respiratory function stable and patient connected to nasal cannula oxygen Cardiovascular status: blood pressure returned to baseline and stable Postop Assessment: no signs of nausea or vomiting Anesthetic complications: no    Last Vitals:  Filed Vitals:   09/18/15 1412 09/18/15 1458  BP: 104/63 101/47  Pulse: 65 66  Temp:  36.8 C  Resp: 14 20    Last Pain:  Filed Vitals:   09/18/15 1830  PainSc: 7                  Honestee Revard S

## 2015-09-18 NOTE — Consult Note (Signed)
Patient ID: Rachel Hurst, female   DOB: Oct 31, 1975, 40 y.o.   MRN: [id number]  HPI Rachel Hurst is a 40 y.o. female asked by Dr. Markham Jordan to be seen in consultation.  She has a  history of hypothyroidism, migraines and anxiety who presents with three day history of abdominal pain. She reports right upper quadrant and epigastric abdominal pain beginning three days ago. It was constant for 20 minutes or so and then became intermittent. Pain was sharp and has a tight sensation. No evidence of jaundice no evidence of fevers or chills no evidence of biliary obstruction. He did have some nausea and vomiting as well. Further workup  LFTs normal including bilirubin, lipase 30, WBC 11.8  RUQ US showing colelithiasis without sonographic evidence of acute cholecystitis. Findings suggesting distal choledocholithiasis with mild dilatation of the common bowel duct measuring 1 cm. The MRCP showed: Cholelithiasis, without radiographic signs of acute cholecystitis. Mild biliary ductal dilatation, with multiple small stones in the distal common bile duct.  Yesterday she had an ERCP and sphincterotomy by Dr.Wohl removal multiple stones in the common bile duct. And this morning she feels okay but last night she had another episode of epigastric and right upper quadrant pain. Her labs have remained normal including normal LFTs and normal lipase.  HPI  Past Medical History  Diagnosis Date  . Hypothyroidism   . Anxiety     History reviewed. No pertinent past surgical history.  Family History  Problem Relation Age of Onset  . Hyperlipidemia Mother     Social History Social History  Substance Use Topics  . Smoking status: Never Smoker   . Smokeless tobacco: Never Used  . Alcohol Use: Yes    No Known Allergies  Current Facility-Administered Medications  Medication Dose Route Frequency Provider Last Rate Last Dose  . 0.9 %  sodium chloride infusion   Intravenous Continuous Alexis Hugelmeyer, DO 100  mL/hr at 09/18/15 0123    . acetaminophen (TYLENOL) tablet 650 mg  650 mg Oral Q6H PRN Alexis Hugelmeyer, DO   650 mg at 09/17/15 0841   Or  . acetaminophen (TYLENOL) suppository 650 mg  650 mg Rectal Q6H PRN Alexis Hugelmeyer, DO      . butalbital-acetaminophen-caffeine (FIORICET, ESGIC) 50-325-40 MG per tablet 1 tablet  1 tablet Oral Q4H PRN Enid Baas, MD   1 tablet at 09/18/15 0610  . escitalopram (LEXAPRO) tablet 10 mg  10 mg Oral Daily Alexis Hugelmeyer, DO   10 mg at 09/18/15 0740  . levothyroxine (SYNTHROID, LEVOTHROID) tablet 200 mcg  200 mcg Oral QAC breakfast Alexis Hugelmeyer, DO   200 mcg at 09/18/15 0740  . ondansetron (ZOFRAN) tablet 4 mg  4 mg Oral Q6H PRN Alexis Hugelmeyer, DO       Or  . ondansetron (ZOFRAN) injection 4 mg  4 mg Intravenous Q6H PRN Alexis Hugelmeyer, DO         Review of Systems A 10 point review of systems was asked and was negative except for the information on the HPI  Physical Exam Blood pressure 102/57, pulse 69, temperature 98.5 F (36.9 C), temperature source Oral, resp. rate 20, height  (1.626 m), weight 81.511 kg (179 lb 11.2 oz), SpO2 98 %. CONSTITUTIONAL: NAD, non toxic. EYES: Pupils are equal, round, and reactive to light, Sclera are non-icteric. EARS, NOSE, MOUTH AND THROAT: The oropharynx is clear. The oral mucosa is pink and moist. Hearing is intact to voice. LYMPH NODES:  Lymph nodes in the  neck are normal. RESPIRATORY:  Lungs are clear. There is normal respiratory effort, with equal breath sounds bilaterally, and without pathologic use of accessory muscles. CARDIOVASCULAR: Heart is regular without murmurs, gallops, or rubs. GI: The abdomen is soft, Mild TTP RUQ, no peritonitis or murphy sign./ There are no palpable masses. There is no hepatosplenomegaly. There are normal bowel sounds in all quadrants. GU: Rectal deferred.   MUSCULOSKELETAL: Normal muscle strength and tone. No cyanosis or edema.   SKIN: Turgor is good and  there are no pathologic skin lesions or ulcers. NEUROLOGIC: Motor and sensation is grossly normal. Cranial nerves are grossly intact. PSYCH:  Oriented to person, place and time. Affect is normal.  Data Reviewed I have personally reviewed the patient's imaging, laboratory findings and medical records.    Assessment/ Plan Symptomatic lithiasis with common bile duct stone status post ERCP and a sphincterotomy. Patient is definitely in need for laparoscopic cholecystectomy. The might be a component of actually acute cholecystitis that has not been affected on diagnostic studies. In any situation the management will be to proceed with laparoscopic cholecystectomy.The risks, benefits, complications, treatment options, and expected outcomes were discussed with the patient. The possibilities of bleeding, recurrent infection, finding a normal gallbladder, perforation of viscus organs, damage to surrounding structures, bile leak, abscess formation, needing a drain placed, the need for additional procedures, reaction to medication, pulmonary aspiration,  failure to diagnose a condition, the possible need to convert to an open procedure, and creating a complication requiring transfusion or operation were discussed with the patient. The patient  concurred with the proposed plan, giving informed consent.  All questions were answered   Sterling Big, MD FACS General Surgeon 09/18/2015, 9:57 AM

## 2015-09-18 NOTE — Op Note (Addendum)
Pre-operative Diagnosis: Cholecystitis w choledocholithiasis  Post-operative Diagnosis: same  Procedure:  Laparoscopic Cholecystectomy Laparoscopic enterolysis 30 min operative time Laparoscopic CBD exploration  Surgeon: Sterling Big, MD FACS  Anesthesia: Gen. with endotracheal tube   Findings: Acute Cholecystitis  CBD debris and sludge Dilated CBD 1 cm Improvement after flushing the CBD and doing exploration w Fogarty  Estimated Blood Loss: 10cc         Drains: none         Specimens: Gallbladder           Complications: none   Procedure Details  The patient was seen again in the Holding Room. The benefits, complications, treatment options, and expected outcomes were discussed with the patient. The risks of bleeding, infection, recurrence of symptoms, failure to resolve symptoms, bile duct damage, bile duct leak, retained common bile duct stone, bowel injury, any of which could require further surgery and/or ERCP, stent, or papillotomy were reviewed with the patient. The likelihood of improving the patient's symptoms with return to their baseline status is good.  The patient and/or family concurred with the proposed plan, giving informed consent.  The patient was taken to Operating Room, identified as Rachel Hurst and the procedure verified as Laparoscopic Cholecystectomy.  A Time Out was held and the above information confirmed.  Prior to the induction of general anesthesia, antibiotic prophylaxis was administered. VTE prophylaxis was in place. General endotracheal anesthesia was then administered and tolerated well. After the induction, the abdomen was prepped with Chloraprep and draped in the sterile fashion. The patient was positioned in the supine position.  Local anesthetic  was injected into the skin near the umbilicus and an incision made. Cut down technique was used to enter the abdominal cavity and a Hasson trochar was placed after two vicryl stitches were anchored  to the fascia. Pneumoperitoneum was then created with CO2 and tolerated well without any adverse changes in the patient's vital signs.  Three 5-mm ports were placed in the right upper quadrant all under direct vision. All skin incisions  were infiltrated with a local anesthetic agent before making the incision and placing the trocars.   The patient was positioned  in reverse Trendelenburg, tilted slightly to the patient's left.  Significant dense adhesions from the GB to the antrum, duodenum and omentum. Using scissors and cautery we were able to take those down very meticulously to avoid any injuries.  The gallbladder was identified, the fundus grasped and retracted cephalad. Adhesions were lysed bluntly. The infundibulum was grasped and retracted laterally, exposing the peritoneum overlying the triangle of Calot. This was then divided and exposed in a blunt fashion. An extended critical view of the cystic duct and cystic artery was obtained.  The cystic duct was clearly identified and bluntly dissected. Cystic duct was incised  And stones were milked with grasper,   cholangiogram performed showing multiple filling defects CBD. Glucagon was given and  cbd flushed  w NS at leat 200cc. Cholangiogram was repeated showing persistent defects. Using 4 FR fogarty I inserted the catheter via the cystic duct , went down to 20 cms and confirmed with floropryl. We swiped the CBD and a lot of debris and sludge was removed via the cystic duct defect. Repeat cholangiogram showed signficant improvement and filling of the duodenum. There was no evidence of CBD injuries or extravasation of contrast to suggest injuries. The fogarty and cholangiogram were removed and the cystic  Artery and duct were double clipped and divided. The gallbladder was  taken from the gallbladder fossa in a retrograde fashion with the electrocautery. The gallbladder was removed and placed in an Endocatch bag. The liver bed was irrigated and inspected.  Hemostasis was achieved with the electrocautery. Copious irrigation was utilized and was repeatedly aspirated until clear.  The gallbladder and Endocatch sac were then removed through the epigastric port site.   Inspection of the right upper quadrant was performed. No bleeding, bile duct injury or leak, or bowel injury was noted. Pneumoperitoneum was released.  The periumbilical port site was closed with figure-of-eight 0 Vicryl sutures. 4-0 subcuticular Monocryl was used to close the skin. Dermabond was  applied.  The patient was then extubated and brought to the recovery room in stable condition. Sponge, lap, and needle counts were correct at closure and at the conclusion of the case.               Sterling Big, MD, FACS

## 2015-09-18 NOTE — Plan of Care (Signed)
Problem: Activity: Goal: Ability to return to normal activity level will improve Outcome: Progressing Pt informed to not get oob without assist at this time r/t general anesthesia and pain medications voiced understanding.

## 2015-09-18 NOTE — Progress Notes (Signed)
[facility] - [place] at [facility]   PATIENT NAME: Rachel Hurst    MR#:  [id number]  DATE OF BIRTH:  [birth year]  SUBJECTIVE:  CHIEF COMPLAINT:  Patient was seen and examined in PACU. Lethargic  REVIEW OF SYSTEMS:  ROS unobtainable  DRUG ALLERGIES:  No Known Allergies  VITALS:  Blood pressure 101/47, pulse 66, temperature 98.3 F (36.8 C), temperature source Oral, resp. rate 20, height 5\' 4"  (1.626 m), weight 81.511 kg (179 lb 11.2 oz), SpO2 96 %.  PHYSICAL EXAMINATION:  GENERAL:  40 y.o.-year-old patient lying in the bed with no acute distress.  EYES: Pupils equal, round, reactive to light and accommodation. No scleral icterus. Extraocular muscles intact.  HEENT: Head atraumatic, normocephalic. Oropharynx and nasopharynx clear.  NECK:  Supple, no jugular venous distention. No thyroid enlargement, no tenderness.  LUNGS: Normal breath sounds bilaterally, no wheezing, rales,rhonchi or crepitation. No use of accessory muscles of respiration.  CARDIOVASCULAR: S1, S2 normal. No murmurs, rubs, or gallops.  ABDOMEN: laparoscopy scars on abdomen, nondistended   EXTREMITIES: No pedal edema, cyanosis, or clubbing.  NEUROLOGIC: Lethargic Gait not checked.  PSYCHIATRIC: The patient is lethargic  SKIN: No obvious rash, lesion, or ulcer.    LABORATORY PANEL:   CBC  Recent Labs Lab 09/18/15 0041  WBC 13.0*  HGB 12.3  HCT 36.2  PLT 188   ------------------------------------------------------------------------------------------------------------------  Chemistries   Recent Labs Lab 09/18/15 0041  NA 137  K 3.6  CL 110  CO2 22  GLUCOSE 100*  BUN 6  CREATININE 0.65  CALCIUM 8.1*  AST 81*  ALT 64*  ALKPHOS 47  BILITOT 1.0   ------------------------------------------------------------------------------------------------------------------  Cardiac Enzymes  Recent Labs Lab 09/16/15 1111  TROPONINI <0.03    ------------------------------------------------------------------------------------------------------------------  RADIOLOGY:  Ct Abdomen Pelvis Wo Contrast  09/18/2015  CLINICAL DATA:  Right upper quadrant abdominal pain. ERCP today for choledocholithiasis. EXAM: CT ABDOMEN AND PELVIS WITHOUT CONTRAST TECHNIQUE: Multidetector CT imaging of the abdomen and pelvis was performed following the standard protocol without IV contrast. COMPARISON:  Abdominal MRI 09/16/2015 and right upper quadrant ultrasound 09/16/2015 FINDINGS: There is minimal dependent atelectasis in the lung bases. No pleural effusion. The small hyperenhancing lesion in the posterior right hepatic lobe on MRI is not seen on this noncontrast CT. Contrast material is present in the gallbladder related to recent ERCP. There is also a biliary gas which is likely postprocedural. Multiple stones are present in the gallbladder. There is persistent mild biliary dilatation, with the common bile duct measuring up to 11 mm in diameter. There are a few punctate densities in the distal common bile duct suggestive of small residual stones and measuring up to 6 mm in aggregate. The spleen, adrenal glands, kidneys, and pancreas have an unremarkable unenhanced appearance. Contrast material is present in the colon. There is mild gaseous distention of the descending and distal transverse colon without evidence of mechanical bowel obstruction the appendix is unremarkable. The bladder is unremarkable. An intrauterine contraceptive device is in place. The ovaries are grossly unremarkable. No free fluid, intraperitoneal free air, or enlarged lymph nodes are identified. No acute osseous abnormality is identified. IMPRESSION: 1. Postprocedural changes from recent ERCP. Mild persistent biliary dilatation with punctate densities in the common bile duct suggestive of residual or recurrent choledocholithiasis. Suggest correlation with intraoperative images from earlier  ERCP. 2. Cholelithiasis. Electronically Signed   By: Sebastian Ache M.D.   On: 09/18/2015 00:29   Dg Cholangiogram Operative  09/18/2015  CLINICAL DATA:  40 year old  female undergoing cholecystectomy symptomatic cholelithiasis. EXAM: INTRAOPERATIVE CHOLANGIOGRAM TECHNIQUE: Cholangiographic images from the C-arm fluoroscopic device were submitted for interpretation post-operatively. Please see the procedural report for the amount of contrast and the fluoroscopy time utilized. COMPARISON:  CT abdomen/ pelvis 09/17/2015 ; MRCP 09/16/2015 FINDINGS: A total of 12 intraoperative saved images obtained during intraoperative cholangiogram. The images demonstrate cannulation of the cystic duct remnant and opacification of the biliary tree. There is mild intra and extrahepatic biliary ductal dilatation. An irregular ovoid filling defect is present in the common bile duct. This is not obstructing. Contrast material passes through the ampulla and into the duodenum. The filling defect is relatively fixed and not mobile. IMPRESSION: 1. Tubular nonocclusive filling defect in the common bile duct most likely represents thrombus/hematoma in the post ERCP setting. A would be difficult to completely exclude small residual choledocholithiasis. 2. The ampulla is patent. Contrast material passes into the duodenum. 3. Persistent mild intra and extrahepatic biliary ductal dilatation is not unexpected. Electronically Signed   By: Malachy Moan M.D.   On: 09/18/2015 13:18   Mr Abd W/wo Cm/mrcp  09/16/2015  CLINICAL DATA:  Right upper quadrant pain for 2 days. Nausea. Cholelithiasis and biliary ductal dilatation seen on recent ultrasound. EXAM: MRI ABDOMEN WITHOUT AND WITH CONTRAST (INCLUDING MRCP) TECHNIQUE: Multiplanar multisequence MR imaging of the abdomen was performed both before and after the administration of intravenous contrast. Heavily T2-weighted images of the biliary and pancreatic ducts were obtained, and  three-dimensional MRCP images were rendered by post processing. CONTRAST:  15mL MULTIHANCE GADOBENATE DIMEGLUMINE 529 MG/ML IV SOLN COMPARISON:  Ultrasound on 09/16/2015 FINDINGS: Lower chest:  No acute findings. Hepatobiliary: 1.5 cm flash filling lesion is seen in the subcapsular posterior right hepatic lobe, segment 7, on arterial phase imaging, image 22/series 17. This shows T2 isointensity, and likely represents a benign etiology such as focal nodular hyperplasia or perfusion anomaly. No other liver lesions identified. Multiple gallstones are seen without evidence of acute cholecystitis. Mild biliary ductal dilatation is seen with common bile duct measuring 9 mm. There are several filling defects seen in the distal common bile duct measuring up to 8 mm, consistent with choledocholithiasis. Pancreas: No mass, inflammatory changes, or other parenchymal abnormality identified. Spleen:  Within normal limits in size and appearance. Adrenals/Urinary Tract: No masses identified. No evidence of hydronephrosis. Mild left lower pole renal scarring noted. Stomach/Bowel: Visualized portions within the abdomen are unremarkable. Vascular/Lymphatic: No pathologically enlarged lymph nodes identified. No abdominal aortic aneurysm demonstrated. Other:  None. Musculoskeletal:  No suspicious bone lesions identified. IMPRESSION: Cholelithiasis, without radiographic signs of acute cholecystitis. Mild biliary ductal dilatation, with multiple small stones in the distal common bile duct. 1.5 cm probably benign lesion in the posterior right hepatic lobe. Continued followup by abdomen MRI without and with Eovist contrast recommended in 6 months. Electronically Signed   By: Myles Rosenthal M.D.   On: 09/16/2015 20:35    EKG:   Orders placed or performed during the hospital encounter of 09/16/15  . ED EKG  . ED EKG  . EKG 12-Lead  . EKG 12-Lead    ASSESSMENT AND PLAN:   This is a 40 year old white female with history of anxiety,  hypothyroidism and migraines who presents with two days of RUQ abdominal pain and now being admitted for: 1. Biliary colic - cholelithiasis, choledocholithiasis without evidence of acute cholecystitis. ERCP done 09/17/15  Pt had RUQ pain last night and had Lap cholecystectomy and lap CBD exploration 09/18/15  MRCP  done in  ED   NPO , pain control, antiemetics, IV fluid hydration and abx  2. Anxiety - continue Lexapro.  3. Migraine headache - continue Naproxen which has helped her in the past.   4. Routine DVT prophylaxis with SCDs and early ambulation given low VTE risk.  5.Incidental posterior liver lesion identified will require outpatient follow up.     All the records are reviewed and case discussed with Care Management/Social Workerr. Management plans discussed with the patient, family and they are in agreement.  CODE STATUS: FC   TOTAL TIME TAKING CARE OF THIS PATIENT: 36  minutes.   POSSIBLE D/C IN 2 DAYS, DEPENDING ON CLINICAL CONDITION.  Note: This dictation was prepared with Dragon dictation along with smaller phrase technology. Any transcriptional errors that result from this process are unintentional.   Ramonita Lab M.D on 09/18/2015 at 3:17 PM  Between 7am to 6pm - Pager - [phone] After 6pm go to [email or web address] - password [email or web address] [facility]  [facility]  Office  [phone]  CC: Primary care physician; No PCP Per Patient

## 2015-09-19 LAB — CBC
HCT: 34.5 % — ABNORMAL LOW (ref 35.0–47.0)
Hemoglobin: 12 g/dL (ref 12.0–16.0)
MCH: 29.7 pg (ref 26.0–34.0)
MCHC: 34.7 g/dL (ref 32.0–36.0)
MCV: 85.5 fL (ref 80.0–100.0)
PLATELETS: 195 10*3/uL (ref 150–440)
RBC: 4.03 MIL/uL (ref 3.80–5.20)
RDW: 13.4 % (ref 11.5–14.5)
WBC: 16.7 10*3/uL — AB (ref 3.6–11.0)

## 2015-09-19 LAB — COMPREHENSIVE METABOLIC PANEL
ALK PHOS: 52 U/L (ref 38–126)
ALT: 111 U/L — AB (ref 14–54)
AST: 71 U/L — AB (ref 15–41)
Albumin: 3.3 g/dL — ABNORMAL LOW (ref 3.5–5.0)
Anion gap: 4 — ABNORMAL LOW (ref 5–15)
BUN: 6 mg/dL (ref 6–20)
CALCIUM: 8.3 mg/dL — AB (ref 8.9–10.3)
CHLORIDE: 111 mmol/L (ref 101–111)
CO2: 23 mmol/L (ref 22–32)
CREATININE: 0.69 mg/dL (ref 0.44–1.00)
GFR calc Af Amer: >60 mL/min (ref >60)
Glucose, Bld: 114 mg/dL — ABNORMAL HIGH (ref 65–99)
Potassium: 3.9 mmol/L (ref 3.5–5.1)
Sodium: 138 mmol/L (ref 135–145)
Total Bilirubin: 0.6 mg/dL (ref 0.3–1.2)
Total Protein: 6.2 g/dL — ABNORMAL LOW (ref 6.5–8.1)

## 2015-09-19 LAB — AMYLASE: Amylase: 54 U/L (ref 28–100)

## 2015-09-19 LAB — LIPASE, BLOOD: LIPASE: 25 U/L (ref 11–51)

## 2015-09-19 NOTE — Progress Notes (Signed)
[facility] - [place] at [facility]   PATIENT NAME: Rachel Hurst    MR#:  [id number]  DATE OF BIRTH:  [birth year]  SUBJECTIVE:  CHIEF COMPLAINT:  PatientIs wide awake and alert and answering all questions appropriately. Feeling much better today  REVIEW OF SYSTEMS:  ROS   CONSTITUTIONAL: No fever, fatigue or weakness.  EYES: No blurred or double vision.  EARS, NOSE, AND THROAT: No tinnitus or ear pain.  RESPIRATORY: No cough, shortness of breath, wheezing or hemoptysis.  CARDIOVASCULAR: No chest pain, orthopnea, edema.  GASTROINTESTINAL: Laparoscopic wounds are painful No nausea, vomiting, diarrhea . GENITOURINARY: No dysuria, hematuria.  ENDOCRINE: No polyuria, nocturia,  HEMATOLOGY: No anemia, easy bruising or bleeding SKIN: No rash or lesion. MUSCULOSKELETAL: No joint pain or arthritis.   NEUROLOGIC: No tingling, numbness, weakness.  PSYCHIATRY: No anxiety or depression  DRUG ALLERGIES:  No Known Allergies  VITALS:  Blood pressure (!) 101/55, pulse 61, temperature 98.5 F (36.9 C), temperature source Oral, resp. rate 18, height  (1.626 m), weight 81.5 kg (179 lb 11.2 oz), SpO2 97 %.  PHYSICAL EXAMINATION:  GENERAL:  40 y.o.-year-old patient lying in the bed with no acute distress.  EYES: Pupils equal, round, reactive to light and accommodation. No scleral icterus. Extraocular muscles intact.  HEENT: Head atraumatic, normocephalic. Oropharynx and nasopharynx clear.  NECK:  Supple, no jugular venous distention. No thyroid enlargement, no tenderness.  LUNGS: Normal breath sounds bilaterally, no wheezing, rales,rhonchi or crepitation. No use of accessory muscles of respiration.  CARDIOVASCULAR: S1, S2 normal. No murmurs, rubs, or gallops.  ABDOMEN: laparoscopy Wounds on abdomen are tender, nondistended   EXTREMITIES: No pedal edema, cyanosis, or clubbing.  NEUROLOGIC: Lethargic Gait not checked.  PSYCHIATRIC: The patient is lethargic  SKIN:  No obvious rash, lesion, or ulcer.    LABORATORY PANEL:   CBC  Recent Labs Lab 09/19/15 0417  WBC 16.7*  HGB 12.0  HCT 34.5*  PLT 195   ------------------------------------------------------------------------------------------------------------------  Chemistries   Recent Labs Lab 09/19/15 0417  NA 138  K 3.9  CL 111  CO2 23  GLUCOSE 114*  BUN 6  CREATININE 0.69  CALCIUM 8.3*  AST 71*  ALT 111*  ALKPHOS 52  BILITOT 0.6   ------------------------------------------------------------------------------------------------------------------  Cardiac Enzymes  Recent Labs Lab 09/16/15 1111  TROPONINI <0.03   ------------------------------------------------------------------------------------------------------------------  RADIOLOGY:  Ct Abdomen Pelvis Wo Contrast  Result Date: 09/18/2015 CLINICAL DATA:  Right upper quadrant abdominal pain. ERCP today for choledocholithiasis. EXAM: CT ABDOMEN AND PELVIS WITHOUT CONTRAST TECHNIQUE: Multidetector CT imaging of the abdomen and pelvis was performed following the standard protocol without IV contrast. COMPARISON:  Abdominal MRI 09/16/2015 and right upper quadrant ultrasound 09/16/2015 FINDINGS: There is minimal dependent atelectasis in the lung bases. No pleural effusion. The small hyperenhancing lesion in the posterior right hepatic lobe on MRI is not seen on this noncontrast CT. Contrast material is present in the gallbladder related to recent ERCP. There is also a biliary gas which is likely postprocedural. Multiple stones are present in the gallbladder. There is persistent mild biliary dilatation, with the common bile duct measuring up to 11 mm in diameter. There are a few punctate densities in the distal common bile duct suggestive of small residual stones and measuring up to 6 mm in aggregate. The spleen, adrenal glands, kidneys, and pancreas have an unremarkable unenhanced appearance. Contrast material is present in the  colon. There is mild gaseous distention of the descending and distal transverse colon without evidence  of mechanical bowel obstruction the appendix is unremarkable. The bladder is unremarkable. An intrauterine contraceptive device is in place. The ovaries are grossly unremarkable. No free fluid, intraperitoneal free air, or enlarged lymph nodes are identified. No acute osseous abnormality is identified. IMPRESSION: 1. Postprocedural changes from recent ERCP. Mild persistent biliary dilatation with punctate densities in the common bile duct suggestive of residual or recurrent choledocholithiasis. Suggest correlation with intraoperative images from earlier ERCP. 2. Cholelithiasis. Electronically Signed   By: Sebastian Ache M.D.   On: 09/18/2015 00:29   Dg Cholangiogram Operative  Result Date: 09/18/2015 CLINICAL DATA:  40 year old female undergoing cholecystectomy symptomatic cholelithiasis. EXAM: INTRAOPERATIVE CHOLANGIOGRAM TECHNIQUE: Cholangiographic images from the C-arm fluoroscopic device were submitted for interpretation post-operatively. Please see the procedural report for the amount of contrast and the fluoroscopy time utilized. COMPARISON:  CT abdomen/ pelvis 09/17/2015 ; MRCP 09/16/2015 FINDINGS: A total of 12 intraoperative saved images obtained during intraoperative cholangiogram. The images demonstrate cannulation of the cystic duct remnant and opacification of the biliary tree. There is mild intra and extrahepatic biliary ductal dilatation. An irregular ovoid filling defect is present in the common bile duct. This is not obstructing. Contrast material passes through the ampulla and into the duodenum. The filling defect is relatively fixed and not mobile. IMPRESSION: 1. Tubular nonocclusive filling defect in the common bile duct most likely represents thrombus/hematoma in the post ERCP setting. A would be difficult to completely exclude small residual choledocholithiasis. 2. The ampulla is patent.  Contrast material passes into the duodenum. 3. Persistent mild intra and extrahepatic biliary ductal dilatation is not unexpected. Electronically Signed   By: Malachy Moan M.D.   On: 09/18/2015 13:18    EKG:   Orders placed or performed during the hospital encounter of 09/16/15  . ED EKG  . ED EKG  . EKG 12-Lead  . EKG 12-Lead    ASSESSMENT AND PLAN:   This is a 40 year old white female with history of anxiety, hypothyroidism and migraines who presents with two days of RUQ abdominal pain and now being admitted for: 1. Biliary colic - cholelithiasis, choledocholithiasis without evidence of acute cholecystitis. ERCP done 09/17/15  Pt had RUQ pain last night and had Lap cholecystectomy and lap CBD exploration 09/18/15 Surgery has recommended to keep her one more day and check lab work as patient has a lot of sludge in the common bile duct  MRCP done in  ED   Continue clear liquids, pain control, antiemetics, IV fluid hydration and abx GI signed off. Appreciate surgery recommendations  2. Anxiety - continue Lexapro.  3. Migraine headache - continue Naproxen which has helped her in the past.   4. Routine DVT prophylaxis with SCDs and early ambulation given low VTE risk.  5.Incidental posterior liver lesion identified will require outpatient follow up.     All the records are reviewed and case discussed with Care Management/Social Workerr. Management plans discussed with the patient, family and they are in agreement.  CODE STATUS: FC   TOTAL TIME TAKING CARE OF THIS PATIENT: 36  minutes.   POSSIBLE D/C IN 2 DAYS, DEPENDING ON CLINICAL CONDITION.  Note: This dictation was prepared with Dragon dictation along with smaller phrase technology. Any transcriptional errors that result from this process are unintentional.   Ramonita Lab M.D on 09/19/2015 at 1:09 PM  Between 7am to 6pm - Pager - [phone] After 6pm go to [email or web address] - Scientist, research (life sciences)  Hospitalists  Office  [phone]  CC: Primary care physician; No PCP Per Patient

## 2015-09-19 NOTE — Consult Note (Signed)
I will sign off.  Reconsult if needed, Dr. Servando Snare should be back tomorrow.

## 2015-09-19 NOTE — Progress Notes (Signed)
POD # 1 s post laparoscopic vasectomy with laparoscopic common bile duct exploration S/p ercp  Doing well she did develop some pain early this morning that subsided with narcotics. AVSS WBC inc . lft mildly elevation expected after chole Tolerated po VSS  PE NAD Abd: soft, appropriate incisional tenderness, no peritonitis, incisions healing well  A/P doing well Keep one more day so we can trend left and wbc There was a lot of sludge in the CBD We will give her time.  No surgical intervention at this time, no biliary obstruction. If her pain persist may consider repeating ERCP to clean some more of the sludge from the CBD

## 2015-09-20 ENCOUNTER — Encounter: Payer: Self-pay | Admitting: Gastroenterology

## 2015-09-20 LAB — COMPREHENSIVE METABOLIC PANEL
ALT: 73 U/L — AB (ref 14–54)
AST: 38 U/L (ref 15–41)
Albumin: 3.4 g/dL — ABNORMAL LOW (ref 3.5–5.0)
Alkaline Phosphatase: 47 U/L (ref 38–126)
Anion gap: 1 — ABNORMAL LOW (ref 5–15)
BILIRUBIN TOTAL: 0.4 mg/dL (ref 0.3–1.2)
BUN: 8 mg/dL (ref 6–20)
CALCIUM: 8.2 mg/dL — AB (ref 8.9–10.3)
CO2: 26 mmol/L (ref 22–32)
CREATININE: 0.74 mg/dL (ref 0.44–1.00)
Chloride: 112 mmol/L — ABNORMAL HIGH (ref 101–111)
GFR calc Af Amer: >60 mL/min (ref >60)
Glucose, Bld: 103 mg/dL — ABNORMAL HIGH (ref 65–99)
Potassium: 3.9 mmol/L (ref 3.5–5.1)
Sodium: 139 mmol/L (ref 135–145)
TOTAL PROTEIN: 6.2 g/dL — AB (ref 6.5–8.1)

## 2015-09-20 LAB — CBC
HCT: 33.9 % — ABNORMAL LOW (ref 35.0–47.0)
Hemoglobin: 11.7 g/dL — ABNORMAL LOW (ref 12.0–16.0)
MCH: 29.7 pg (ref 26.0–34.0)
MCHC: 34.5 g/dL (ref 32.0–36.0)
MCV: 86 fL (ref 80.0–100.0)
PLATELETS: 187 10*3/uL (ref 150–440)
RBC: 3.94 MIL/uL (ref 3.80–5.20)
RDW: 13.6 % (ref 11.5–14.5)
WBC: 9.9 10*3/uL (ref 3.6–11.0)

## 2015-09-20 MED ORDER — OXYCODONE HCL 5 MG PO TABS: 5.0000 mg | ORAL_TABLET | Freq: Four times a day (QID) | ORAL | 0 refills | Status: DC | PRN

## 2015-09-20 MED ORDER — ACETAMINOPHEN 325 MG PO TABS: 650.0000 mg | ORAL_TABLET | Freq: Four times a day (QID) | ORAL | Status: DC | PRN

## 2015-09-20 NOTE — Discharge Summary (Signed)
[facility] Physicians -  at [facility]   PATIENT NAME: Rachel Hurst    MR#:  [id number]  DATE OF BIRTH:  1975-08-18  DATE OF ADMISSION:  09/16/2015 ADMITTING PHYSICIAN: Enid Baas, MD  DATE OF DISCHARGE: 09/20/15  PRIMARY CARE PHYSICIAN: No PCP Per Patient    ADMISSION DIAGNOSIS:  Biliary colic [K80.50] RUQ pain [R10.11]  DISCHARGE DIAGNOSIS:  Active Problems:   Abdominal pain, acute, right upper quadrant   Biliary colic   Cholelithiasis   SECONDARY DIAGNOSIS:   Past Medical History:  Diagnosis Date  . Anxiety   . Hypothyroidism     HOSPITAL COURSE:   This is a 40 year old white female with history of anxiety, hypothyroidism and migraines who presents with two days of RUQ abdominal pain and now being admitted for: 1. Biliary colic - cholelithiasis, choledocholithiasis without evidence of acute cholecystitis. ERCP done 09/17/15  Pt had RUQ pain last night and had Lap cholecystectomy and lap CBD exploration 09/18/15  MRCP done in  ED  Patient clinically improved  ContinueLow-fat diet, pain control, at discharge. During the hospital course we have provided IV fluid hydration and abx. WBCs back to normal GI signed off. Appreciate surgery recommendations. Outpatient follow-up in 1-2 weeks is recommended  2. Anxiety - continue Lexapro.  3. Migraine headache - continue Naproxen which has helped her in the past.   4. Routine DVT prophylaxis with SCDs and early ambulation given low VTE risk.  5.Incidental posterior liver lesion identified will require outpatient follow up.    DISCHARGE CONDITIONS:  fair  CONSULTS OBTAINED:  Treatment Team:  Scot Jun, MD Leafy Ro, MD   PROCEDURES ERCP done 09/17/15  Pt had RUQ pain last night and had Lap cholecystectomy and lap CBD exploration 09/18/15  DRUG ALLERGIES:  No Known Allergies  DISCHARGE MEDICATIONS:   Current Discharge Medication List    START taking these  medications   Details  acetaminophen (TYLENOL) 325 MG tablet Take 2 tablets (650 mg total) by mouth every 6 (six) hours as needed for mild pain (or Fever >/= 101).    oxyCODONE (OXY IR/ROXICODONE) 5 MG immediate release tablet Take 1 tablet (5 mg total) by mouth every 6 (six) hours as needed for moderate pain, severe pain or breakthrough pain. Qty: 30 tablet, Refills: 0      CONTINUE these medications which have NOT CHANGED   Details  escitalopram (LEXAPRO) 10 MG tablet Take 10 mg by mouth daily.    levothyroxine (SYNTHROID, LEVOTHROID) 200 MCG tablet Take 200 mcg by mouth daily before breakfast.         DISCHARGE INSTRUCTIONS:  Activity as tolerated Follow-up with primary care physician in a week Follow-up with surgery Dr. Valaria Good in 1-2 weeks   DIET:  Low fat, Low cholesterol diet  DISCHARGE CONDITION:  Fair  ACTIVITY:  Activity as tolerated  OXYGEN:  Home Oxygen: No.   Oxygen Delivery: room air  DISCHARGE LOCATION:  home   If you experience worsening of your admission symptoms, develop shortness of breath, life threatening emergency, suicidal or homicidal thoughts you must seek medical attention immediately by calling 911 or calling your MD immediately  if symptoms less severe.  You Must read complete instructions/literature along with all the possible adverse reactions/side effects for all the Medicines you take and that have been prescribed to you. Take any new Medicines after you have completely understood and accpet all the possible adverse reactions/side effects.   Please note  You were  cared for by a hospitalist during your hospital stay. If you have any questions about your discharge medications or the care you received while you were in the hospital after you are discharged, you can call the unit and asked to speak with the hospitalist on call if the hospitalist that took care of you is not available. Once you are discharged, your primary care physician will  handle any further medical issues. Please note that NO REFILLS for any discharge medications will be authorized once you are discharged, as it is imperative that you return to your primary care physician (or establish a relationship with a primary care physician if you do not have one) for your aftercare needs so that they can reassess your need for medications and monitor your lab values.     Today  Chief Complaint  Patient presents with  . Abdominal Pain   Patient is resting comfortably. Tolerating diet. Passing gas. No bowel movement yet. Comfortable to go home. Okay to discharge patient from surgical standpoint.  ROS:  CONSTITUTIONAL: Denies fevers, chills. Denies any fatigue, weakness.  EYES: Denies blurry vision, double vision, eye pain. EARS, NOSE, THROAT: Denies tinnitus, ear pain, hearing loss. RESPIRATORY: Denies cough, wheeze, shortness of breath.  CARDIOVASCULAR: Denies chest pain, palpitations, edema.  GASTROINTESTINAL: Denies nausea, vomiting, diarrhea, reports abdominal pain in the laparoscopic  areas. Denies bright red blood per rectum. GENITOURINARY: Denies dysuria, hematuria. ENDOCRINE: Denies nocturia or thyroid problems. HEMATOLOGIC AND LYMPHATIC: Denies easy bruising or bleeding. SKIN: Denies rash or lesion. MUSCULOSKELETAL: Denies pain in neck, back, shoulder, knees, hips or arthritic symptoms.  NEUROLOGIC: Denies paralysis, paresthesias.  PSYCHIATRIC: Denies anxiety or depressive symptoms.   VITAL SIGNS:  Blood pressure (!) 104/57, pulse 82, temperature 98.6 F (37 C), temperature source Oral, resp. rate 20, height 5\' 4"  (1.626 m), weight 81.5 kg (179 lb 11.2 oz), SpO2 97 %.  I/O:    Intake/Output Summary (Last 24 hours) at 09/20/15 1415 Last data filed at 09/20/15 0900  Gross per 24 hour  Intake             2730 ml  Output                0 ml  Net             2730 ml    PHYSICAL EXAMINATION:  GENERAL:  40 y.o.-year-old patient lying in the bed with  no acute distress.  EYES: Pupils equal, round, reactive to light and accommodation. No scleral icterus. Extraocular muscles intact.  HEENT: Head atraumatic, normocephalic. Oropharynx and nasopharynx clear.  NECK:  Supple, no jugular venous distention. No thyroid enlargement, no tenderness.  LUNGS: Normal breath sounds bilaterally, no wheezing, rales,rhonchi or crepitation. No use of accessory muscles of respiration.  CARDIOVASCULAR: S1, S2 normal. No murmurs, rubs, or gallops.  ABDOMEN: Soft, Tender in the laparoscopic wound areas but no rebound tenderness non-distended. Bowel sounds present. No organomegaly or mass.  EXTREMITIES: No pedal edema, cyanosis, or clubbing.  NEUROLOGIC: Cranial nerves II through XII are intact. Muscle strength 5/5 in all extremities. Sensation intact. Gait not checked.  PSYCHIATRIC: The patient is alert and oriented x 3.  SKIN: No obvious rash, lesion, or ulcer.   DATA REVIEW:   CBC  Recent Labs Lab 09/20/15 0504  WBC 9.9  HGB 11.7*  HCT 33.9*  PLT 187    Chemistries   Recent Labs Lab 09/20/15 0504  NA 139  K 3.9  CL 112*  CO2 26  GLUCOSE  103*  BUN 8  CREATININE 0.74  CALCIUM 8.2*  AST 38  ALT 73*  ALKPHOS 47  BILITOT 0.4    Cardiac Enzymes  Recent Labs Lab 09/16/15 1111  TROPONINI <0.03    Microbiology Results  No results found for this or any previous visit.  RADIOLOGY:  Ct Abdomen Pelvis Wo Contrast  Result Date: 09/18/2015 CLINICAL DATA:  Right upper quadrant abdominal pain. ERCP today for choledocholithiasis. EXAM: CT ABDOMEN AND PELVIS WITHOUT CONTRAST TECHNIQUE: Multidetector CT imaging of the abdomen and pelvis was performed following the standard protocol without IV contrast. COMPARISON:  Abdominal MRI 09/16/2015 and right upper quadrant ultrasound 09/16/2015 FINDINGS: There is minimal dependent atelectasis in the lung bases. No pleural effusion. The small hyperenhancing lesion in the posterior right hepatic lobe on MRI  is not seen on this noncontrast CT. Contrast material is present in the gallbladder related to recent ERCP. There is also a biliary gas which is likely postprocedural. Multiple stones are present in the gallbladder. There is persistent mild biliary dilatation, with the common bile duct measuring up to 11 mm in diameter. There are a few punctate densities in the distal common bile duct suggestive of small residual stones and measuring up to 6 mm in aggregate. The spleen, adrenal glands, kidneys, and pancreas have an unremarkable unenhanced appearance. Contrast material is present in the colon. There is mild gaseous distention of the descending and distal transverse colon without evidence of mechanical bowel obstruction the appendix is unremarkable. The bladder is unremarkable. An intrauterine contraceptive device is in place. The ovaries are grossly unremarkable. No free fluid, intraperitoneal free air, or enlarged lymph nodes are identified. No acute osseous abnormality is identified. IMPRESSION: 1. Postprocedural changes from recent ERCP. Mild persistent biliary dilatation with punctate densities in the common bile duct suggestive of residual or recurrent choledocholithiasis. Suggest correlation with intraoperative images from earlier ERCP. 2. Cholelithiasis. Electronically Signed   By: Sebastian Ache M.D.   On: 09/18/2015 00:29   Dg Cholangiogram Operative  Result Date: 09/18/2015 CLINICAL DATA:  41 year old female undergoing cholecystectomy symptomatic cholelithiasis. EXAM: INTRAOPERATIVE CHOLANGIOGRAM TECHNIQUE: Cholangiographic images from the C-arm fluoroscopic device were submitted for interpretation post-operatively. Please see the procedural report for the amount of contrast and the fluoroscopy time utilized. COMPARISON:  CT abdomen/ pelvis 09/17/2015 ; MRCP 09/16/2015 FINDINGS: A total of 12 intraoperative saved images obtained during intraoperative cholangiogram. The images demonstrate cannulation of  the cystic duct remnant and opacification of the biliary tree. There is mild intra and extrahepatic biliary ductal dilatation. An irregular ovoid filling defect is present in the common bile duct. This is not obstructing. Contrast material passes through the ampulla and into the duodenum. The filling defect is relatively fixed and not mobile. IMPRESSION: 1. Tubular nonocclusive filling defect in the common bile duct most likely represents thrombus/hematoma in the post ERCP setting. A would be difficult to completely exclude small residual choledocholithiasis. 2. The ampulla is patent. Contrast material passes into the duodenum. 3. Persistent mild intra and extrahepatic biliary ductal dilatation is not unexpected. Electronically Signed   By: Malachy Moan M.D.   On: 09/18/2015 13:18   Mr Roe Coombs W/wo Cm/mrcp  Result Date: 09/16/2015 CLINICAL DATA:  Right upper quadrant pain for 2 days. Nausea. Cholelithiasis and biliary ductal dilatation seen on recent ultrasound. EXAM: MRI ABDOMEN WITHOUT AND WITH CONTRAST (INCLUDING MRCP) TECHNIQUE: Multiplanar multisequence MR imaging of the abdomen was performed both before and after the administration of intravenous contrast. Heavily T2-weighted images of the biliary  and pancreatic ducts were obtained, and three-dimensional MRCP images were rendered by post processing. CONTRAST:  75mL MULTIHANCE GADOBENATE DIMEGLUMINE 529 MG/ML IV SOLN COMPARISON:  Ultrasound on 09/16/2015 FINDINGS: Lower chest:  No acute findings. Hepatobiliary: 1.5 cm flash filling lesion is seen in the subcapsular posterior right hepatic lobe, segment 7, on arterial phase imaging, image 22/series 17. This shows T2 isointensity, and likely represents a benign etiology such as focal nodular hyperplasia or perfusion anomaly. No other liver lesions identified. Multiple gallstones are seen without evidence of acute cholecystitis. Mild biliary ductal dilatation is seen with common bile duct measuring 9 mm.  There are several filling defects seen in the distal common bile duct measuring up to 8 mm, consistent with choledocholithiasis. Pancreas: No mass, inflammatory changes, or other parenchymal abnormality identified. Spleen:  Within normal limits in size and appearance. Adrenals/Urinary Tract: No masses identified. No evidence of hydronephrosis. Mild left lower pole renal scarring noted. Stomach/Bowel: Visualized portions within the abdomen are unremarkable. Vascular/Lymphatic: No pathologically enlarged lymph nodes identified. No abdominal aortic aneurysm demonstrated. Other:  None. Musculoskeletal:  No suspicious bone lesions identified. IMPRESSION: Cholelithiasis, without radiographic signs of acute cholecystitis. Mild biliary ductal dilatation, with multiple small stones in the distal common bile duct. 1.5 cm probably benign lesion in the posterior right hepatic lobe. Continued followup by abdomen MRI without and with Eovist contrast recommended in 6 months. Electronically Signed   By: Myles Rosenthal M.D.   On: 09/16/2015 20:35    EKG:   Orders placed or performed during the hospital encounter of 09/16/15  . ED EKG  . ED EKG  . EKG 12-Lead  . EKG 12-Lead      Management plans discussed with the patient, family and they are in agreement.  CODE STATUS:     Code Status Orders        Start     Ordered   09/17/15 0150  Full code  Continuous     09/17/15 0149    Code Status History    Date Active Date Inactive Code Status Order ID Comments User Context   This patient has a current code status but no historical code status.      TOTAL TIME TAKING CARE OF THIS PATIENT: 45 minutes.   Note: This dictation was prepared with Dragon dictation along with smaller phrase technology. Any transcriptional errors that result from this process are unintentional.   @[facility] @  on 09/20/2015 at 2:15 PM  Between 7am to 6pm - Pager - [phone]  After 6pm go to [email or web address] - password [email or web address] [facility]  [place]  [place] [facility]  Office  [phone]  CC: Primary care physician; No PCP Per Patient

## 2015-09-20 NOTE — Progress Notes (Signed)
Per Dr Merlinda Frederick surgeon ok to discharge pt

## 2015-09-20 NOTE — Progress Notes (Signed)
[facility]         [place], [place].   09/20/2015  Patient: Rachel Hurst   Date of Birth:  10-10-1975  Date of admission:  09/16/2015  Date of Discharge  09/20/2015    To Whom it May Concern:   Avya Dollins  may return to work on 09/27/15.  PHYSICAL ACTIVITY:  Moderate  If you have any questions or concerns, please don't hesitate to call.  Sincerely,   Ramonita Lab M.D Pager [phone] Office : [phone]   .

## 2015-09-20 NOTE — Discharge Instructions (Signed)
Activity as tolerated Follow-up with primary care physician in a week Follow-up with surgery Dr. Valaria Good in 1-2 weeks

## 2015-09-20 NOTE — Progress Notes (Signed)
Pt being discharged home, discharge instructions and prescriptions reviewed with pt, work note given, states understanding, pt with no noted complaints, no distress or discomfort noted

## 2015-09-21 ENCOUNTER — Encounter: Payer: Self-pay | Admitting: Nurse Practitioner

## 2015-09-21 LAB — SURGICAL PATHOLOGY

## 2015-09-27 ENCOUNTER — Telehealth: Payer: Self-pay

## 2015-09-27 ENCOUNTER — Encounter: Payer: Self-pay | Admitting: Surgery

## 2015-09-27 NOTE — Telephone Encounter (Signed)
LVM for patient to call office.

## 2015-09-28 NOTE — Telephone Encounter (Signed)
Spoke with patient and she is continuing to have a rash under her breast, upper thighs, and stomach. She has currently been taking Benadryl for the past 4 days with no relief. She had an appointment on Friday but due to the bothersome rash we will see her in the office tomorrow @ 10:45 with Dr. Orvis Brill.

## 2015-09-29 ENCOUNTER — Ambulatory Visit (INDEPENDENT_AMBULATORY_CARE_PROVIDER_SITE_OTHER): Payer: Self-pay | Admitting: Surgery

## 2015-09-29 ENCOUNTER — Encounter: Payer: Self-pay | Admitting: Surgery

## 2015-09-29 VITALS — BP 110/76 | HR 76 | Temp 98.7°F | Ht 64.0 in | Wt 173.4 lb

## 2015-09-29 DIAGNOSIS — K8050 Calculus of bile duct without cholangitis or cholecystitis without obstruction: Secondary | ICD-10-CM

## 2015-09-29 DIAGNOSIS — R1011 Right upper quadrant pain: Secondary | ICD-10-CM

## 2015-09-29 DIAGNOSIS — R1010 Upper abdominal pain, unspecified: Secondary | ICD-10-CM

## 2015-09-29 NOTE — Progress Notes (Signed)
40 yr old female status post laparoscopic cholecystectomy for acute cholecystitis on July 22 with Dr. Everlene Farrier. Patient states that she is eating well and having good bowel movements. Patient states that she has a rash on her abdomen that she noticed from the day she was discharged. Patient states that she also has some mid back pain that radiates around the rib cage. Patient from has no fever or chills and has a good appetite.  Vitals:   09/29/15 0957  BP: 110/76  Pulse: 76  Temp: 98.7 F (37.1 C)   PE:  Gen: NAD Abd: soft, non-tender, incisions c/d/i sterile glue in place, healing well no erythema or drainage, small Petechial rash along the entirety of the abdomen and underneath both inframammary folds. Underneath the inframammary fold such as red and shiny as well.   A/P:  Patient is doing well after a laparoscopic cholecystectomy I discussed her pathology of chronic cholecystitis and no dysplasia. Discussed that she's having any reflux type symptoms to take a PPI for the next 2 weeks that should help. Also discussed using powder cornstarch underneath the breast to keep the area from being moist to help heal the rash. Told her to continue the Benadryl and to use some hydrocortisone cream for help with itching. It appears this likely from the sterile prep in the area that it's in. This should improve over the next week or so. She is to call with any questions or concerns.

## 2015-10-01 ENCOUNTER — Encounter: Payer: Self-pay | Admitting: Surgery

## 2015-12-22 ENCOUNTER — Emergency Department
Admission: EM | Admit: 2015-12-22 | Discharge: 2015-12-22 | Disposition: A | Discharge status: Home / Self Care | Payer: Self-pay | Attending: Emergency Medicine | Admitting: Emergency Medicine

## 2015-12-22 ENCOUNTER — Emergency Department: Payer: Self-pay

## 2015-12-22 DIAGNOSIS — M5440 Lumbago with sciatica, unspecified side: Secondary | ICD-10-CM | POA: Insufficient documentation

## 2015-12-22 DIAGNOSIS — E039 Hypothyroidism, unspecified: Secondary | ICD-10-CM | POA: Insufficient documentation

## 2015-12-22 DIAGNOSIS — Z79899 Other long term (current) drug therapy: Secondary | ICD-10-CM | POA: Insufficient documentation

## 2015-12-22 DIAGNOSIS — M5430 Sciatica, unspecified side: Secondary | ICD-10-CM

## 2015-12-22 DIAGNOSIS — R51 Headache: Secondary | ICD-10-CM | POA: Insufficient documentation

## 2015-12-22 DIAGNOSIS — F419 Anxiety disorder, unspecified: Secondary | ICD-10-CM | POA: Insufficient documentation

## 2015-12-22 LAB — POCT PREGNANCY, URINE: Preg Test, Ur: NEGATIVE

## 2015-12-22 MED ORDER — METHYLPREDNISOLONE 4 MG PO TBPK: ORAL_TABLET | ORAL | 0 refills | Status: DC

## 2015-12-22 MED ORDER — DEXAMETHASONE SODIUM PHOSPHATE 10 MG/ML IJ SOLN
10.0000 mg | Freq: Once | INTRAMUSCULAR | Status: AC
  Administered 2015-12-22: 10 mg via INTRAMUSCULAR
  Filled 2015-12-22: qty 1

## 2015-12-22 MED ORDER — ORPHENADRINE CITRATE 30 MG/ML IJ SOLN
60.0000 mg | Freq: Two times a day (BID) | INTRAMUSCULAR | Status: DC
  Administered 2015-12-22: 60 mg via INTRAMUSCULAR
  Filled 2015-12-22: qty 2

## 2015-12-22 MED ORDER — METHOCARBAMOL 750 MG PO TABS: 750.0000 mg | ORAL_TABLET | Freq: Four times a day (QID) | ORAL | 0 refills | Status: DC

## 2015-12-22 MED ORDER — HYDROMORPHONE HCL 1 MG/ML IJ SOLN
1.0000 mg | Freq: Once | INTRAMUSCULAR | Status: AC
  Administered 2015-12-22: 1 mg via INTRAMUSCULAR
  Filled 2015-12-22: qty 1

## 2015-12-22 NOTE — ED Triage Notes (Signed)
Pt reports in the last week she has experienced severe lower back pain with shooting pain and tingling down both legs. 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 Pt reports exercise, rest and pain medication (vicodin) has not worked. Pt reports difficulty bending, walking and sitting for long periods of time. Pt able to move legs, feet and toes in triage and can walk without assistance. Denies urinary changes or fever.

## 2015-12-22 NOTE — ED Notes (Signed)
Pt states lower back pain and sciatica for 1 week, denies any injury, pt ambulatory

## 2015-12-22 NOTE — ED Provider Notes (Signed)
[facility] Emergency Department Provider Note   ____________________________________________   First MD Initiated Contact with Patient 12/22/15 1358     (approximate)  I have reviewed the triage vital signs and the nursing notes.   HISTORY  Chief Complaint Back Pain    HPI Ramond Craverllison Jean Crawshaw is a 40 y.o. female patient complaining of radicular back pain to bilateral lower extremities. Patient denies any bladder bowel dysfunction. Patient stated pain increases with flexion walking and prolonged sitting.Patient rates the pain as 8/10. Patient described a pain as "sharp". Patient state pain is refractory to rest and narcotic pain medications.   Past Medical History:  Diagnosis Date  . Anxiety   . Frequent headaches   . Hypothyroidism   . Migraine   . Thyroid disease 2006   Low thyroid    Patient Active Problem List   Diagnosis Date Noted  . Hypothyroidism 04/27/2014  . Encounter to establish care 04/27/2014  . Lump of breast, left 04/27/2014    Past Surgical History:  Procedure Laterality Date  . CHOLECYSTECTOMY N/A 09/18/2015   Procedure: LAPAROSCOPIC CHOLECYSTECTOMY WITH INTRAOPERATIVE CHOLANGIOGRAM and exploration common bile duct;  Surgeon: Leafy Roiego F Pabon, MD;  Location: [facility] ORS;  Service: General;  Laterality: N/A;  . ERCP N/A 09/17/2015   Procedure: ENDOSCOPIC RETROGRADE CHOLANGIOPANCREATOGRAPHY (ERCP);  Surgeon: Midge Miniumarren Wohl, MD;  Location: [facility] ENDOSCOPY;  Service: Endoscopy;  Laterality: N/A;  . TONSILLECTOMY AND ADENOIDECTOMY  1976    Prior to Admission medications   Medication Sig Start Date End Date Taking? Authorizing Provider  escitalopram (LEXAPRO) 10 MG tablet TAKE 1 TABLET (10 MG TOTAL) BY MOUTH DAILY. 02/17/15   Carollee Leitzarrie M Doss, NP  escitalopram (LEXAPRO) 10 MG tablet Take 10 mg by mouth daily.    Historical Provider, MD  levothyroxine (SYNTHROID, LEVOTHROID) 200 MCG tablet Take 1 tablet (200 mcg total) by mouth daily. 08/04/15    Tommie SamsJayce G Cook, DO  levothyroxine (SYNTHROID, LEVOTHROID) 200 MCG tablet Take 200 mcg by mouth daily before breakfast.    Historical Provider, MD  methocarbamol (ROBAXIN-750) 750 MG tablet Take 1 tablet (750 mg total) by mouth 4 (four) times daily. 12/22/15   Joni Reiningonald K Smith, PA-C  methylPREDNISolone (MEDROL DOSEPAK) 4 MG TBPK tablet Take Tapered dose as directed 12/22/15   Joni Reiningonald K Smith, PA-C  tiZANidine (ZANAFLEX) 4 MG tablet Take 4 mg by mouth every 6 (six) hours as needed for muscle spasms.    Historical Provider, MD    Allergies Review of patient's allergies indicates no known allergies.  Family History  Problem Relation Age of Onset  . Hyperlipidemia Mother   . Hyperlipidemia Sister   . Diabetes Sister     Social History Social History  Substance Use Topics  . Smoking status: Never Smoker  . Smokeless tobacco: Never Used  . Alcohol use Yes     Comment: occasionally    Review of Systems Constitutional: No fever/chills Eyes: No visual changes. ENT: No sore throat. Cardiovascular: Denies chest pain. Respiratory: Denies shortness of breath. Gastrointestinal: No abdominal pain.  No nausea, no vomiting.  No diarrhea.  No constipation. Genitourinary: Negative for dysuria. Musculoskeletal: Negative for back pain. Skin: Negative for rash. Neurological: Positive for headaches, but denies focal weakness or numbness. Psychiatric:Anxiety Endocrine:Hypothyroidism ____________________________________________   PHYSICAL EXAM:  VITAL SIGNS: ED Triage Vitals  Enc Vitals Group     BP 12/22/15 1319 (!) 114/51     Pulse Rate 12/22/15 1319 77     Resp 12/22/15 1319 20  Temp 12/22/15 1319 98.4 F (36.9 C)     Temp Source 12/22/15 1319 Oral     SpO2 12/22/15 1319 100 %     Weight 12/22/15 1319 170 lb (77.1 kg)     Height 12/22/15 1319 5\' 4"  (1.626 m)     Head Circumference --      Peak Flow --      Pain Score 12/22/15 1320 8     Pain Loc --      Pain Edu? --      Excl.  in GC? --     Constitutional: Alert and oriented. Well appearing and in no acute distress.Patient desires to remain standing throughout the exam. Eyes: Conjunctivae are normal. PERRL. EOMI. Head: Atraumatic. Nose: No congestion/rhinnorhea. Mouth/Throat: Mucous membranes are moist.  Oropharynx non-erythematous. Neck: No stridor.  No cervical spine tenderness to palpation. Hematological/Lymphatic/Immunilogical: No cervical lymphadenopathy. Cardiovascular: Normal rate, regular rhythm. Grossly normal heart sounds.  Good peripheral circulation. Respiratory: Normal respiratory effort.  No retractions. Lungs CTAB. Gastrointestinal: Soft and nontender. No distention. No abdominal bruits. No CVA tenderness. Musculoskeletal: No obvious deformity of the lumbar spine. Patient hads moderate guarding with palpation of L2-S1. Patient's decreased range of motion with flexion. Patient had bilateral paraspinal muscle spasms with lateral movements. Patient had negative straight leg test. Neurologic:  Normal speech and language. No gross focal neurologic deficits are appreciated. No gait instability. Skin:  Skin is warm, dry and intact. No rash noted. Psychiatric: Mood and affect are normal. Speech and behavior are normal.  ____________________________________________   LABS (all labs ordered are listed, but only abnormal results are displayed)  Labs Reviewed  POCT PREGNANCY, URINE   ____________________________________________  EKG   ____________________________________________  RADIOLOGY  No acute findings x-ray of the lumbar spine. ____________________________________________   PROCEDURES  Procedure(s) performed: None  Procedures  Critical Care performed: No  ____________________________________________   INITIAL IMPRESSION / ASSESSMENT AND PLAN / ED COURSE  Pertinent labs & imaging results that were available during my care of the patient were reviewed by me and considered in my  medical decision making (see chart for details).  Radicular back pain. Discussed negative x-ray finding with patient. Patient given discharge care instructions. Patient advised follow orthopedics if condition persists.  Clinical Course  Patient state pain resolved status post Dilaudid, Norflex and Decadron.   ____________________________________________   FINAL CLINICAL IMPRESSION(S) / ED DIAGNOSES  Final diagnoses:  Sciatica, unspecified laterality      NEW MEDICATIONS STARTED DURING THIS VISIT:  New Prescriptions   METHOCARBAMOL (ROBAXIN-750) 750 MG TABLET    Take 1 tablet (750 mg total) by mouth 4 (four) times daily.   METHYLPREDNISOLONE (MEDROL DOSEPAK) 4 MG TBPK TABLET    Take Tapered dose as directed     Note:  This document was prepared using Dragon voice recognition software and may include unintentional dictation errors.    Joni Reining, PA-C 12/22/15 1534    Governor Rooks, MD 12/26/15 1245

## 2015-12-25 ENCOUNTER — Encounter: Payer: Self-pay | Admitting: Surgery

## 2016-09-14 ENCOUNTER — Telehealth: Payer: Self-pay | Admitting: Obstetrics & Gynecology

## 2016-09-14 NOTE — Telephone Encounter (Signed)
Pt is schedule Aug 22 at 10 am with Dr. Tiburcio PeaHarris

## 2016-09-22 NOTE — Telephone Encounter (Signed)
Noted. Will order to arrive by apt date/time. 

## 2016-10-10 ENCOUNTER — Ambulatory Visit: Payer: Self-pay | Admitting: Obstetrics & Gynecology

## 2016-10-10 NOTE — Telephone Encounter (Signed)
Noted  

## 2016-10-10 NOTE — Telephone Encounter (Signed)
Pt was reschedule insertion and removal of mirena to 9// with Tiburcio PeaHarris

## 2016-10-18 ENCOUNTER — Ambulatory Visit: Payer: Self-pay | Admitting: Obstetrics & Gynecology

## 2016-10-18 ENCOUNTER — Encounter: Payer: Self-pay | Admitting: Obstetrics & Gynecology

## 2016-10-18 ENCOUNTER — Ambulatory Visit (INDEPENDENT_AMBULATORY_CARE_PROVIDER_SITE_OTHER): Payer: Managed Care, Other (non HMO) | Admitting: Obstetrics & Gynecology

## 2016-10-18 VITALS — BP 120/80 | HR 78 | Ht 64.0 in | Wt 176.0 lb

## 2016-10-18 DIAGNOSIS — G43829 Menstrual migraine, not intractable, without status migrainosus: Secondary | ICD-10-CM | POA: Insufficient documentation

## 2016-10-18 DIAGNOSIS — E039 Hypothyroidism, unspecified: Secondary | ICD-10-CM | POA: Diagnosis not present

## 2016-10-18 DIAGNOSIS — Z01411 Encounter for gynecological examination (general) (routine) with abnormal findings: Secondary | ICD-10-CM

## 2016-10-18 DIAGNOSIS — Z1239 Encounter for other screening for malignant neoplasm of breast: Secondary | ICD-10-CM

## 2016-10-18 DIAGNOSIS — Z1231 Encounter for screening mammogram for malignant neoplasm of breast: Secondary | ICD-10-CM

## 2016-10-18 DIAGNOSIS — Z124 Encounter for screening for malignant neoplasm of cervix: Secondary | ICD-10-CM

## 2016-10-18 DIAGNOSIS — Z0000 Encounter for general adult medical examination without abnormal findings: Secondary | ICD-10-CM

## 2016-10-18 NOTE — Progress Notes (Signed)
HPI:      [name] is a 41 y.o. [phone] who LMP was No LMP recorded. Patient is not currently having periods (Reason: IUD)., she presents today for her annual examination. The patient has no complaints today. The patient is sexually active. Her last pap: approximate date 2013 and was normal and last mammogram: approximate date 2014 and was normal. The patient does perform self breast exams.  There is no notable family history of breast or ovarian cancer in her family.  The patient has regular exercise: yes.  The patient denies current symptoms of depression.    GYN History: Contraception: IUD  PMHx: Past Medical History:  Diagnosis Date  . Anxiety   . Frequent headaches   . Hypothyroidism   . Migraine   . Thyroid disease 2006   Low thyroid   Past Surgical History:  Procedure Laterality Date  . CHOLECYSTECTOMY N/A 09/18/2015   Procedure: LAPAROSCOPIC CHOLECYSTECTOMY WITH INTRAOPERATIVE CHOLANGIOGRAM and exploration common bile duct;  Surgeon: Leafy Ro, MD;  Location: [facility] ORS;  Service: General;  Laterality: N/A;  . ERCP N/A 09/17/2015   Procedure: ENDOSCOPIC RETROGRADE CHOLANGIOPANCREATOGRAPHY (ERCP);  Surgeon: Midge Minium, MD;  Location: [facility] ENDOSCOPY;  Service: Endoscopy;  Laterality: N/A;  . TONSILLECTOMY AND ADENOIDECTOMY  1976   Family History  Problem Relation Age of Onset  . Hyperlipidemia Mother   . Hyperlipidemia Sister   . Diabetes Sister    Social History  Substance Use Topics  . Smoking status: Never Smoker  . Smokeless tobacco: Never Used  . Alcohol use Yes     Comment: occasionally    Current Outpatient Prescriptions:  .  escitalopram (LEXAPRO) 10 MG tablet, TAKE 1 TABLET (10 MG TOTAL) BY MOUTH DAILY., Disp: 30 tablet, Rfl: 3 .  escitalopram (LEXAPRO) 10 MG tablet, Take 10 mg by mouth daily., Disp: , Rfl:  .  levothyroxine (SYNTHROID, LEVOTHROID) 200 MCG tablet, Take 1 tablet (200 mcg total) by mouth daily., Disp: 90 tablet, Rfl: 1 .   levothyroxine (SYNTHROID, LEVOTHROID) 200 MCG tablet, Take 200 mcg by mouth daily before breakfast., Disp: , Rfl:  .  methocarbamol (ROBAXIN-750) 750 MG tablet, Take 1 tablet (750 mg total) by mouth 4 (four) times daily., Disp: 20 tablet, Rfl: 0 .  methylPREDNISolone (MEDROL DOSEPAK) 4 MG TBPK tablet, Take Tapered dose as directed, Disp: 21 tablet, Rfl: 0 .  tiZANidine (ZANAFLEX) 4 MG tablet, Take 4 mg by mouth every 6 (six) hours as needed for muscle spasms., Disp: , Rfl:  Allergies: Patient has no known allergies.  Review of Systems  Constitutional: Negative for chills, fever and malaise/fatigue.  HENT: Negative for congestion, sinus pain and sore throat.   Eyes: Negative for blurred vision and pain.  Respiratory: Negative for cough and wheezing.   Cardiovascular: Negative for chest pain and leg swelling.  Gastrointestinal: Negative for abdominal pain, constipation, diarrhea, heartburn, nausea and vomiting.  Genitourinary: Negative for dysuria, frequency, hematuria and urgency.  Musculoskeletal: Negative for back pain, joint pain, myalgias and neck pain.  Skin: Negative for itching and rash.  Neurological: Negative for dizziness, tremors and weakness.  Endo/Heme/Allergies: Does not bruise/bleed easily.  Psychiatric/Behavioral: Negative for depression. The patient is not nervous/anxious and does not have insomnia.     Objective: BP 120/80   Pulse 78   Ht 5\' 4"  (1.626 m)   Wt 176 lb (79.8 kg)   BMI 30.21 kg/m   Filed Weights   10/18/16 1005  Weight: 176 lb (79.8 kg)  Body mass index is 30.21 kg/m. Physical Exam  Constitutional: She is oriented to person, place, and time. She appears well-developed and well-nourished. No distress.  Genitourinary: Rectum normal, vagina normal and uterus normal. Pelvic exam was performed with patient supine. There is no rash or lesion on the right labia. There is no rash or lesion on the left labia. Vagina exhibits no lesion. No bleeding in the  vagina. Right adnexum does not display mass and does not display tenderness. Left adnexum does not display mass and does not display tenderness. Cervix does not exhibit motion tenderness, lesion, friability or polyp.   Uterus is mobile and midaxial. Uterus is not enlarged or exhibiting a mass.  Genitourinary Comments: IUD string 1 cm  HENT:  Head: Normocephalic and atraumatic. Head is without laceration.  Right Ear: Hearing normal.  Left Ear: Hearing normal.  Nose: No epistaxis.  No foreign bodies.  Mouth/Throat: Uvula is midline, oropharynx is clear and moist and mucous membranes are normal.  Eyes: Pupils are equal, round, and reactive to light.  Neck: Normal range of motion. Neck supple. No thyromegaly present.  Cardiovascular: Normal rate and regular rhythm.  Exam reveals no gallop and no friction rub.   No murmur heard. Pulmonary/Chest: Effort normal and breath sounds normal. No respiratory distress. She has no wheezes. Right breast exhibits no mass, no skin change and no tenderness. Left breast exhibits no mass, no skin change and no tenderness.  Abdominal: Soft. Bowel sounds are normal. She exhibits no distension. There is no tenderness. There is no rebound.  Musculoskeletal: Normal range of motion.  Neurological: She is alert and oriented to person, place, and time. No cranial nerve deficit.  Skin: Skin is warm and dry.  Psychiatric: She has a normal mood and affect. Judgment normal.  Vitals reviewed.   Assessment:  ANNUAL EXAM 1. Annual physical exam   2. Screening for breast cancer   3. Screening for cervical cancer   4. Menstrual migraine without status migrainosus, not intractable   5. Hypothyroidism, unspecified type    Screening Plan:            1.  Cervical Screening-  Pap smear done today  2. Breast screening- Exam annually and mammogram>40 planned   3. Colonoscopy every 10 years, Hemoccult testing - after age 63  4. Labs managed by PCP  5. Counseling for  contraception: IUD  IUD exchange scheduled soon Migraines may be related to IUD hormones waning in effectiveness. Monitor after new IUD placed IBF prior to procedure  Other:  1. Annual physical exam  2. Screening for breast cancer  3. Screening for cervical cancer    F/U  Return in about 1 year (around 10/18/2017) for Annual.  Annamarie Major, MD, [facility], [facility] 10/18/2016  10:28 AM

## 2016-10-18 NOTE — Patient Instructions (Addendum)
PAP every three years Mammogram every year    Call [phone] to schedule at [facility] [address] Colonoscopy every 10 years starting at age 43 Labs yearly (with PCP)  IUD exchange soon  Levonorgestrel intrauterine device (IUD) What is this medicine? LEVONORGESTREL IUD (LEE voe nor jes trel) is a contraceptive (birth control) device. The device is placed inside the uterus by a healthcare professional. It is used to prevent pregnancy. This device can also be used to treat heavy bleeding that occurs during your period. This medicine may be used for other purposes; ask your health care provider or pharmacist if you have questions. COMMON BRAND NAME(S): Cameron Ali What should I tell my health care provider before I take this medicine? They need to know if you have any of these conditions: -abnormal Pap smear -cancer of the breast, uterus, or cervix -diabetes -endometritis -genital or pelvic infection now or in the past -have more than one sexual partner or your partner has more than one partner -heart disease -history of an ectopic or tubal pregnancy -immune system problems -IUD in place -liver disease or tumor -problems with blood clots or take blood-thinners -seizures -use intravenous drugs -uterus of unusual shape -vaginal bleeding that has not been explained -an unusual or allergic reaction to levonorgestrel, other hormones, silicone, or polyethylene, medicines, foods, dyes, or preservatives -pregnant or trying to get pregnant -breast-feeding How should I use this medicine? This device is placed inside the uterus by a health care professional. Talk to your pediatrician regarding the use of this medicine in children. Special care may be needed. Overdosage: If you think you have taken too much of this medicine contact a poison control center or emergency room at once. NOTE: This medicine is only for you. Do not share this medicine with others. What if I miss a  dose? This does not apply. Depending on the brand of device you have inserted, the device will need to be replaced every 3 to 5 years if you wish to continue using this type of birth control. What may interact with this medicine? Do not take this medicine with any of the following medications: -amprenavir -bosentan -fosamprenavir This medicine may also interact with the following medications: -aprepitant -armodafinil -barbiturate medicines for inducing sleep or treating seizures -bexarotene -boceprevir -griseofulvin -medicines to treat seizures like carbamazepine, ethotoin, felbamate, oxcarbazepine, phenytoin, topiramate -modafinil -pioglitazone -rifabutin -rifampin -rifapentine -some medicines to treat HIV infection like atazanavir, efavirenz, indinavir, lopinavir, nelfinavir, tipranavir, ritonavir -St. John's wort -warfarin This list may not describe all possible interactions. Give your health care provider a list of all the medicines, herbs, non-prescription drugs, or dietary supplements you use. Also tell them if you smoke, drink alcohol, or use illegal drugs. Some items may interact with your medicine. What should I watch for while using this medicine? Visit your doctor or health care professional for regular check ups. See your doctor if you or your partner has sexual contact with others, becomes HIV positive, or gets a sexual transmitted disease. This product does not protect you against HIV infection (AIDS) or other sexually transmitted diseases. You can check the placement of the IUD yourself by reaching up to the top of your vagina with clean fingers to feel the threads. Do not pull on the threads. It is a good habit to check placement after each menstrual period. Call your doctor right away if you feel more of the IUD than just the threads or if you cannot feel the threads at all. The IUD may  come out by itself. You may become pregnant if the device comes out. If you notice  that the IUD has come out use a backup birth control method like condoms and call your health care provider. Using tampons will not change the position of the IUD and are okay to use during your period. This IUD can be safely scanned with magnetic resonance imaging (MRI) only under specific conditions. Before you have an MRI, tell your healthcare provider that you have an IUD in place, and which type of IUD you have in place. What side effects may I notice from receiving this medicine? Side effects that you should report to your doctor or health care professional as soon as possible: -allergic reactions like skin rash, itching or hives, swelling of the face, lips, or tongue -fever, flu-like symptoms -genital sores -high blood pressure -no menstrual period for 6 weeks during use -pain, swelling, warmth in the leg -pelvic pain or tenderness -severe or sudden headache -signs of pregnancy -stomach cramping -sudden shortness of breath -trouble with balance, talking, or walking -unusual vaginal bleeding, discharge -yellowing of the eyes or skin Side effects that usually do not require medical attention (report to your doctor or health care professional if they continue or are bothersome): -acne -breast pain -change in sex drive or performance -changes in weight -cramping, dizziness, or faintness while the device is being inserted -headache -irregular menstrual bleeding within first 3 to 6 months of use -nausea This list may not describe all possible side effects. Call your doctor for medical advice about side effects. You may report side effects to FDA at 1-800-FDA-1088. Where should I keep my medicine? This does not apply. NOTE: This sheet is a summary. It may not cover all possible information. If you have questions about this medicine, talk to your doctor, pharmacist, or health care provider.  2018 Elsevier/Gold Standard (2015-11-26 14:14:56)

## 2016-10-21 LAB — IGP, APTIMA HPV
HPV APTIMA: NEGATIVE
PAP Smear Comment: 0

## 2016-10-31 ENCOUNTER — Ambulatory Visit: Payer: Self-pay | Admitting: Obstetrics & Gynecology

## 2016-11-09 ENCOUNTER — Ambulatory Visit: Payer: Self-pay | Admitting: Obstetrics & Gynecology

## 2016-11-21 ENCOUNTER — Ambulatory Visit: Payer: Self-pay | Admitting: Obstetrics & Gynecology

## 2017-01-15 ENCOUNTER — Telehealth: Payer: Self-pay | Admitting: Obstetrics & Gynecology

## 2017-01-15 NOTE — Telephone Encounter (Signed)
-----   Message from Mychart, Generic sent at 01/14/2017 10:58 PM EST -----    Appointment Request From: Ramond CraverAllison Jean Dominik    With Provider: Letitia Libraobert Paul Harris, MD [[facility]]    Preferred Date Range: 01/24/2017 - 02/02/2017    Preferred Times: Monday Morning, Tuesday Morning, Wednesday Morning, Thursday Morning, Friday Morning    Reason for visit: Office Visit    Comments:  Need appt to have Mirena replaced   Called and left voicemail for pt to call back to be schedule

## 2017-01-22 NOTE — Telephone Encounter (Signed)
[facility]

## 2017-01-30 ENCOUNTER — Encounter: Payer: Self-pay | Admitting: Obstetrics & Gynecology

## 2017-01-30 ENCOUNTER — Ambulatory Visit (INDEPENDENT_AMBULATORY_CARE_PROVIDER_SITE_OTHER): Payer: Managed Care, Other (non HMO) | Admitting: Obstetrics & Gynecology

## 2017-01-30 VITALS — BP 98/60 | HR 84 | Ht 64.0 in | Wt 176.0 lb

## 2017-01-30 DIAGNOSIS — Z30430 Encounter for insertion of intrauterine contraceptive device: Secondary | ICD-10-CM

## 2017-01-30 DIAGNOSIS — Z30433 Encounter for removal and reinsertion of intrauterine contraceptive device: Secondary | ICD-10-CM

## 2017-01-30 NOTE — Patient Instructions (Signed)

## 2017-01-30 NOTE — Progress Notes (Signed)
History of Present Illness:  Rachel Craverllison Jean Everage is a 41 y.o. that had a Mirena IUD placed approximately 5 years ago. Since that time, she states that she has had no periods and tolerates it well. Desires another..  The following portions of the patient's history were reviewed and updated as appropriate: allergies, current medications, past family history, past medical history, past social history, past surgical history and problem list.  Patient Active Problem List   Diagnosis Date Noted  . Menstrual migraine without status migrainosus, not intractable 10/18/2016  . Hypothyroidism 04/27/2014  . Screening for breast cancer 04/27/2014  . Lump of breast, left 04/27/2014   Medications:  Current Outpatient Medications on File Prior to Visit  Medication Sig Dispense Refill  . levothyroxine (SYNTHROID, LEVOTHROID) 200 MCG tablet Take 1 tablet (200 mcg total) by mouth daily. 90 tablet 1  . Diclofenac Sodium CR 100 MG 24 hr tablet Take by mouth.    . DULoxetine (CYMBALTA) 20 MG capsule Take by mouth.    . escitalopram (LEXAPRO) 10 MG tablet TAKE 1 TABLET (10 MG TOTAL) BY MOUTH DAILY. (Patient not taking: Reported on 01/30/2017) 30 tablet 3  . escitalopram (LEXAPRO) 10 MG tablet Take 10 mg by mouth daily.    Marland Kitchen. levonorgestrel (MIRENA, 52 MG,) 20 MCG/24HR IUD by Intrauterine route.    Marland Kitchen. levothyroxine (SYNTHROID, LEVOTHROID) 200 MCG tablet Take 200 mcg by mouth daily before breakfast.    . methocarbamol (ROBAXIN-750) 750 MG tablet Take 1 tablet (750 mg total) by mouth 4 (four) times daily. (Patient not taking: Reported on 01/30/2017) 20 tablet 0  . methylPREDNISolone (MEDROL DOSEPAK) 4 MG TBPK tablet Take Tapered dose as directed (Patient not taking: Reported on 01/30/2017) 21 tablet 0  . SYNTHROID 175 MCG tablet     . tiZANidine (ZANAFLEX) 4 MG tablet Take 4 mg by mouth every 6 (six) hours as needed for muscle spasms.    Marland Kitchen. zolmitriptan (ZOMIG) 5 MG tablet      No current facility-administered  medications on file prior to visit.    Allergies: has No Known Allergies.  Physical Exam:  BP 98/60   Pulse 84   Ht 5\' 4"  (1.626 m)   Wt 176 lb (79.8 kg)   BMI 30.21 kg/m  Body mass index is 30.21 kg/m. Constitutional: Well nourished, well developed female in no acute distress.  Abdomen: diffusely non tender to palpation, non distended, and no masses, hernias Neuro: Grossly intact Psych:  Normal mood and affect.    Pelvic exam:  Two IUD strings present seen coming from the cervical os. EGBUS, vaginal vault and cervix: within normal limits  IUD Removal Strings of IUD identified and grasped.  IUD removed without problem.  Pt tolerated this well.  IUD noted to be intact.  Assessment: IUD Removal  Plan: IUD removed and plan for contraception is IUD.  IUD PROCEDURE NOTE:  Rachel Hurst is a 41 y.o. [phone] here for IUD insertion. No GYN concerns.  Last pap smear was normal.  IUD Insertion Procedure Note Patient identified, informed consent performed, consent signed.   Discussed risks of irregular bleeding, cramping, infection, malpositioning or misplacement of the IUD outside the uterus which may require further procedure such as laparoscopy, risk of failure <1%. Time out was performed.  Urine pregnancy test negative.  A bimanual exam showed the uterus to be midposition.  Speculum placed in the vagina.  Cervix visualized.  Cleaned with Betadine x 2.  Grasped anteriorly with a single tooth tenaculum.  Uterus sounded to 7 cm.   IUD placed per manufacturer's recommendations.  Strings trimmed to 3 cm. Tenaculum was removed, good hemostasis noted.  Patient tolerated procedure well.   Patient was given post-procedure instructions.  She was advised to have backup contraception for one week.  Patient was also asked to check IUD strings periodically and follow up in 4 weeks for IUD check.  Annamarie MajorPaul Chilton Sallade, MD, Merlinda FrederickFACOG [facility], [facility] 01/30/2017  11:13 AM

## 2017-03-02 ENCOUNTER — Ambulatory Visit: Payer: Managed Care, Other (non HMO) | Admitting: Obstetrics & Gynecology

## 2017-04-23 ENCOUNTER — Other Ambulatory Visit: Payer: Self-pay

## 2017-04-23 ENCOUNTER — Encounter: Payer: Self-pay | Admitting: Emergency Medicine

## 2017-04-23 ENCOUNTER — Inpatient Hospital Stay
Admission: EM | Admit: 2017-04-23 | Discharge: 2017-04-25 | DRG: 446 | Disposition: A | Discharge status: Home / Self Care | Payer: 59 | Attending: Internal Medicine | Admitting: Internal Medicine

## 2017-04-23 DIAGNOSIS — K8050 Calculus of bile duct without cholangitis or cholecystitis without obstruction: Secondary | ICD-10-CM | POA: Diagnosis not present

## 2017-04-23 DIAGNOSIS — F329 Major depressive disorder, single episode, unspecified: Secondary | ICD-10-CM | POA: Diagnosis present

## 2017-04-23 DIAGNOSIS — E039 Hypothyroidism, unspecified: Secondary | ICD-10-CM | POA: Diagnosis present

## 2017-04-23 DIAGNOSIS — R17 Unspecified jaundice: Secondary | ICD-10-CM

## 2017-04-23 DIAGNOSIS — K8070 Calculus of gallbladder and bile duct without cholecystitis without obstruction: Secondary | ICD-10-CM | POA: Diagnosis not present

## 2017-04-23 DIAGNOSIS — R918 Other nonspecific abnormal finding of lung field: Secondary | ICD-10-CM

## 2017-04-23 DIAGNOSIS — F419 Anxiety disorder, unspecified: Secondary | ICD-10-CM | POA: Diagnosis present

## 2017-04-23 DIAGNOSIS — K8020 Calculus of gallbladder without cholecystitis without obstruction: Secondary | ICD-10-CM | POA: Diagnosis present

## 2017-04-23 DIAGNOSIS — Z79899 Other long term (current) drug therapy: Secondary | ICD-10-CM

## 2017-04-23 DIAGNOSIS — Z79890 Hormone replacement therapy (postmenopausal): Secondary | ICD-10-CM

## 2017-04-23 LAB — CBC
HEMATOCRIT: 38.8 % (ref 35.0–47.0)
Hemoglobin: 12.9 g/dL (ref 12.0–16.0)
MCH: 28.8 pg (ref 26.0–34.0)
MCHC: 33.3 g/dL (ref 32.0–36.0)
MCV: 86.3 fL (ref 80.0–100.0)
Platelets: 283 10*3/uL (ref 150–440)
RBC: 4.5 MIL/uL (ref 3.80–5.20)
RDW: 13.7 % (ref 11.5–14.5)
WBC: 12.5 10*3/uL — ABNORMAL HIGH (ref 3.6–11.0)

## 2017-04-23 NOTE — ED Triage Notes (Addendum)
Pt presents to ED with epigastric pain that radiates between her scapula and into her chest. Has occurred intermittently since Saturday. +nausea pt states pain is similar to her pain just prior to having her gallbladder removed.

## 2017-04-24 ENCOUNTER — Encounter
Admission: EM | Disposition: A | Discharge status: Home / Self Care | Payer: Self-pay | Source: Home / Self Care | Attending: Internal Medicine

## 2017-04-24 ENCOUNTER — Inpatient Hospital Stay: Payer: 59 | Admitting: Certified Registered"

## 2017-04-24 ENCOUNTER — Emergency Department: Payer: 59

## 2017-04-24 ENCOUNTER — Other Ambulatory Visit: Payer: Self-pay

## 2017-04-24 ENCOUNTER — Inpatient Hospital Stay: Payer: 59

## 2017-04-24 ENCOUNTER — Encounter: Payer: Self-pay | Admitting: Radiology

## 2017-04-24 DIAGNOSIS — F329 Major depressive disorder, single episode, unspecified: Secondary | ICD-10-CM | POA: Diagnosis present

## 2017-04-24 DIAGNOSIS — K8020 Calculus of gallbladder without cholecystitis without obstruction: Secondary | ICD-10-CM | POA: Diagnosis present

## 2017-04-24 DIAGNOSIS — K8070 Calculus of gallbladder and bile duct without cholecystitis without obstruction: Secondary | ICD-10-CM | POA: Diagnosis present

## 2017-04-24 DIAGNOSIS — F419 Anxiety disorder, unspecified: Secondary | ICD-10-CM | POA: Diagnosis present

## 2017-04-24 DIAGNOSIS — K8050 Calculus of bile duct without cholangitis or cholecystitis without obstruction: Secondary | ICD-10-CM | POA: Diagnosis present

## 2017-04-24 DIAGNOSIS — Z79890 Hormone replacement therapy (postmenopausal): Secondary | ICD-10-CM | POA: Diagnosis not present

## 2017-04-24 DIAGNOSIS — Z79899 Other long term (current) drug therapy: Secondary | ICD-10-CM | POA: Diagnosis not present

## 2017-04-24 DIAGNOSIS — E039 Hypothyroidism, unspecified: Secondary | ICD-10-CM | POA: Diagnosis present

## 2017-04-24 HISTORY — PX: ERCP: SHX5425

## 2017-04-24 LAB — CBC
HCT: 38.6 % (ref 35.0–47.0)
Hemoglobin: 12.8 g/dL (ref 12.0–16.0)
MCH: 28.7 pg (ref 26.0–34.0)
MCHC: 33.3 g/dL (ref 32.0–36.0)
MCV: 86.2 fL (ref 80.0–100.0)
PLATELETS: 269 10*3/uL (ref 150–440)
RBC: 4.47 MIL/uL (ref 3.80–5.20)
RDW: 14 % (ref 11.5–14.5)
WBC: 8.5 10*3/uL (ref 3.6–11.0)

## 2017-04-24 LAB — POCT PREGNANCY, URINE: PREG TEST UR: NEGATIVE

## 2017-04-24 LAB — COMPREHENSIVE METABOLIC PANEL
ALBUMIN: 4 g/dL (ref 3.5–5.0)
ALK PHOS: 57 U/L (ref 38–126)
ALT: 39 U/L (ref 14–54)
AST: 66 U/L — ABNORMAL HIGH (ref 15–41)
Anion gap: 9 (ref 5–15)
BILIRUBIN TOTAL: 0.5 mg/dL (ref 0.3–1.2)
BUN: 12 mg/dL (ref 6–20)
CO2: 22 mmol/L (ref 22–32)
Calcium: 8.9 mg/dL (ref 8.9–10.3)
Chloride: 107 mmol/L (ref 101–111)
Creatinine, Ser: 0.87 mg/dL (ref 0.44–1.00)
GFR calc Af Amer: >60 mL/min (ref >60)
GFR calc non Af Amer: >60 mL/min (ref >60)
GLUCOSE: 149 mg/dL — AB (ref 65–99)
Potassium: 4.3 mmol/L (ref 3.5–5.1)
Sodium: 138 mmol/L (ref 135–145)
TOTAL PROTEIN: 7.5 g/dL (ref 6.5–8.1)

## 2017-04-24 LAB — CREATININE, SERUM: Creatinine, Ser: 0.71 mg/dL (ref 0.44–1.00)

## 2017-04-24 LAB — URINALYSIS, COMPLETE (UACMP) WITH MICROSCOPIC
Bilirubin Urine: NEGATIVE
Glucose, UA: NEGATIVE mg/dL
Ketones, ur: NEGATIVE mg/dL
Nitrite: NEGATIVE
Protein, ur: NEGATIVE mg/dL
SPECIFIC GRAVITY, URINE: 1.024 (ref 1.005–1.030)
pH: 6 (ref 5.0–8.0)

## 2017-04-24 LAB — LIPASE, BLOOD: Lipase: 38 U/L (ref 11–51)

## 2017-04-24 LAB — TROPONIN I

## 2017-04-24 SURGERY — ERCP, WITH INTERVENTION IF INDICATED
Anesthesia: General

## 2017-04-24 MED ORDER — ONDANSETRON HCL 4 MG/2ML IJ SOLN: 4.0000 mg | Freq: Four times a day (QID) | INTRAMUSCULAR | Status: DC | PRN

## 2017-04-24 MED ORDER — DULOXETINE HCL 20 MG PO CPEP
40.0000 mg | ORAL_CAPSULE | Freq: Every day | ORAL | Status: DC
  Administered 2017-04-24 – 2017-04-25 (×2): 40 mg via ORAL
  Filled 2017-04-24 (×2): qty 2

## 2017-04-24 MED ORDER — GLYCOPYRROLATE 0.2 MG/ML IJ SOLN
INTRAMUSCULAR | Status: AC
  Filled 2017-04-24: qty 1

## 2017-04-24 MED ORDER — TRAMADOL HCL 50 MG PO TABS
50.0000 mg | ORAL_TABLET | Freq: Four times a day (QID) | ORAL | Status: DC | PRN
  Administered 2017-04-24 – 2017-04-25 (×3): 50 mg via ORAL
  Filled 2017-04-24 (×5): qty 1

## 2017-04-24 MED ORDER — MIDAZOLAM HCL 2 MG/2ML IJ SOLN
INTRAMUSCULAR | Status: AC
  Filled 2017-04-24: qty 2

## 2017-04-24 MED ORDER — GLYCOPYRROLATE 0.2 MG/ML IJ SOLN
INTRAMUSCULAR | Status: DC | PRN
  Administered 2017-04-24: 0.1 mg via INTRAVENOUS

## 2017-04-24 MED ORDER — PROPOFOL 500 MG/50ML IV EMUL
INTRAVENOUS | Status: AC
  Filled 2017-04-24: qty 50

## 2017-04-24 MED ORDER — ACETAMINOPHEN 650 MG RE SUPP: 650.0000 mg | Freq: Four times a day (QID) | RECTAL | Status: DC | PRN

## 2017-04-24 MED ORDER — PHENYLEPHRINE HCL 10 MG/ML IJ SOLN
INTRAMUSCULAR | Status: DC | PRN
  Administered 2017-04-24 (×2): 100 ug via INTRAVENOUS

## 2017-04-24 MED ORDER — IOPAMIDOL (ISOVUE-300) INJECTION 61%
30.0000 mL | Freq: Once | INTRAVENOUS | Status: AC
  Administered 2017-04-24: 30 mL via ORAL

## 2017-04-24 MED ORDER — MORPHINE SULFATE (PF) 4 MG/ML IV SOLN
4.0000 mg | Freq: Once | INTRAVENOUS | Status: AC
  Administered 2017-04-24: 4 mg via INTRAVENOUS
  Filled 2017-04-24: qty 1

## 2017-04-24 MED ORDER — DEXAMETHASONE SODIUM PHOSPHATE 10 MG/ML IJ SOLN
INTRAMUSCULAR | Status: AC
  Filled 2017-04-24: qty 1

## 2017-04-24 MED ORDER — ESCITALOPRAM OXALATE 10 MG PO TABS
10.0000 mg | ORAL_TABLET | Freq: Every day | ORAL | Status: DC
  Administered 2017-04-24: 10 mg via ORAL
  Filled 2017-04-24 (×2): qty 1

## 2017-04-24 MED ORDER — ONDANSETRON HCL 4 MG PO TABS
4.0000 mg | ORAL_TABLET | Freq: Four times a day (QID) | ORAL | Status: DC | PRN
Start: 2017-04-24 — End: 2017-04-25

## 2017-04-24 MED ORDER — GADOBENATE DIMEGLUMINE 529 MG/ML IV SOLN
20.0000 mL | Freq: Once | INTRAVENOUS | Status: AC | PRN
  Administered 2017-04-24: 16 mL via INTRAVENOUS

## 2017-04-24 MED ORDER — INDOMETHACIN 50 MG RE SUPP
100.0000 mg | Freq: Once | RECTAL | Status: AC
  Administered 2017-04-24: 100 mg via RECTAL

## 2017-04-24 MED ORDER — MORPHINE SULFATE (PF) 2 MG/ML IV SOLN: 2.0000 mg | INTRAVENOUS | Status: DC | PRN

## 2017-04-24 MED ORDER — ENOXAPARIN SODIUM 40 MG/0.4ML ~~LOC~~ SOLN
40.0000 mg | SUBCUTANEOUS | Status: DC
  Administered 2017-04-24: 40 mg via SUBCUTANEOUS
  Filled 2017-04-24: qty 0.4

## 2017-04-24 MED ORDER — ONDANSETRON HCL 4 MG/2ML IJ SOLN
INTRAMUSCULAR | Status: AC
  Filled 2017-04-24: qty 2

## 2017-04-24 MED ORDER — SODIUM CHLORIDE 0.9 % IV SOLN
Freq: Once | INTRAVENOUS | Status: AC
  Administered 2017-04-24: 12:00:00 via INTRAVENOUS

## 2017-04-24 MED ORDER — BISACODYL 10 MG RE SUPP: 10.0000 mg | Freq: Every day | RECTAL | Status: DC | PRN

## 2017-04-24 MED ORDER — IOPAMIDOL (ISOVUE-300) INJECTION 61%
100.0000 mL | Freq: Once | INTRAVENOUS | Status: AC | PRN
  Administered 2017-04-24: 100 mL via INTRAVENOUS

## 2017-04-24 MED ORDER — ONDANSETRON HCL 4 MG/2ML IJ SOLN
4.0000 mg | INTRAMUSCULAR | Status: AC
  Administered 2017-04-24: 4 mg via INTRAVENOUS
  Filled 2017-04-24: qty 2

## 2017-04-24 MED ORDER — HYDROMORPHONE HCL 1 MG/ML IJ SOLN
INTRAMUSCULAR | Status: AC
  Filled 2017-04-24: qty 1

## 2017-04-24 MED ORDER — PROPOFOL 500 MG/50ML IV EMUL
INTRAVENOUS | Status: DC | PRN
  Administered 2017-04-24: 150 ug/kg/min via INTRAVENOUS

## 2017-04-24 MED ORDER — LEVOTHYROXINE SODIUM 175 MCG PO TABS
175.0000 ug | ORAL_TABLET | Freq: Every day | ORAL | Status: DC
  Administered 2017-04-25: 175 ug via ORAL
  Filled 2017-04-24: qty 1

## 2017-04-24 MED ORDER — ACETAMINOPHEN 325 MG PO TABS: 650.0000 mg | ORAL_TABLET | Freq: Four times a day (QID) | ORAL | Status: DC | PRN

## 2017-04-24 MED ORDER — MIDAZOLAM HCL 2 MG/2ML IJ SOLN
INTRAMUSCULAR | Status: DC | PRN
  Administered 2017-04-24: 2 mg via INTRAVENOUS

## 2017-04-24 MED ORDER — POLYETHYLENE GLYCOL 3350 17 G PO PACK: 17.0000 g | PACK | Freq: Every day | ORAL | Status: DC | PRN

## 2017-04-24 MED ORDER — LIDOCAINE HCL (PF) 2 % IJ SOLN
INTRAMUSCULAR | Status: AC
  Filled 2017-04-24: qty 10

## 2017-04-24 MED ORDER — DEXAMETHASONE SODIUM PHOSPHATE 10 MG/ML IJ SOLN
INTRAMUSCULAR | Status: DC | PRN
  Administered 2017-04-24: 10 mg via INTRAVENOUS

## 2017-04-24 MED ORDER — SODIUM CHLORIDE 0.9 % IV SOLN
INTRAVENOUS | Status: DC
  Administered 2017-04-24: 22:00:00 via INTRAVENOUS
  Administered 2017-04-24: 1000 mL via INTRAVENOUS
  Administered 2017-04-24 – 2017-04-25 (×2): via INTRAVENOUS

## 2017-04-24 MED ORDER — ONDANSETRON HCL 4 MG/2ML IJ SOLN
INTRAMUSCULAR | Status: DC | PRN
  Administered 2017-04-24: 4 mg via INTRAVENOUS

## 2017-04-24 MED ORDER — HYDROMORPHONE HCL 1 MG/ML IJ SOLN
INTRAMUSCULAR | Status: DC | PRN
  Administered 2017-04-24: 1 mg via INTRAVENOUS

## 2017-04-24 NOTE — H&P (Signed)
Midge Minium, MD [facility] [address], [address] [place], [place] [address] [phone] Fax : [phone]  Primary Care Physician:  Patient, No Pcp Per Primary Gastroenterologist:  Dr. Servando Snare  Pre-Procedure History & Physical: HPI:  Rachel Hurst is a 42 y.o. female is here for an ERCP.   Past Medical History:  Diagnosis Date  . Anxiety   . Frequent headaches   . Hypothyroidism   . Migraine   . Thyroid disease 2006   Low thyroid    Past Surgical History:  Procedure Laterality Date  . CHOLECYSTECTOMY N/A 09/18/2015   Procedure: LAPAROSCOPIC CHOLECYSTECTOMY WITH INTRAOPERATIVE CHOLANGIOGRAM and exploration common bile duct;  Surgeon: Leafy Ro, MD;  Location: [facility] ORS;  Service: General;  Laterality: N/A;  . ERCP N/A 09/17/2015   Procedure: ENDOSCOPIC RETROGRADE CHOLANGIOPANCREATOGRAPHY (ERCP);  Surgeon: Midge Minium, MD;  Location: [facility] ENDOSCOPY;  Service: Endoscopy;  Laterality: N/A;  . TONSILLECTOMY AND ADENOIDECTOMY  1976    Prior to Admission medications   Medication Sig Start Date End Date Taking? Authorizing Provider  diclofenac (VOLTAREN) 75 MG EC tablet Take 75 mg by mouth as needed (migraines).   Yes [provider]  DULoxetine (CYMBALTA) 20 MG capsule Take 40 mg by mouth daily.    Yes [provider]  SYNTHROID 175 MCG tablet Take 175 mcg by mouth daily before breakfast.  01/08/17  Yes [provider]  ZOMIG 5 MG nasal solution Place 1 spray into both nostrils as needed for migraine.   Yes [provider]  escitalopram (LEXAPRO) 10 MG tablet TAKE 1 TABLET (10 MG TOTAL) BY MOUTH DAILY. Patient not taking: Reported on 01/30/2017 02/17/15   Carollee Leitz, RN  escitalopram (LEXAPRO) 10 MG tablet Take 10 mg by mouth daily.    [provider]  levothyroxine (SYNTHROID, LEVOTHROID) 200 MCG tablet Take 1 tablet (200 mcg total) by mouth daily. Patient not taking: Reported on 04/24/2017 08/04/15   Tommie Sams, DO    methocarbamol (ROBAXIN-750) 750 MG tablet Take 1 tablet (750 mg total) by mouth 4 (four) times daily. Patient not taking: Reported on 01/30/2017 12/22/15   Joni Reining, PA-C  methylPREDNISolone (MEDROL DOSEPAK) 4 MG TBPK tablet Take Tapered dose as directed Patient not taking: Reported on 01/30/2017 12/22/15   Joni Reining, PA-C    Allergies as of 04/23/2017  . (No Known Allergies)    Family History  Problem Relation Age of Onset  . Hyperlipidemia Mother   . Hyperlipidemia Sister   . Diabetes Sister     Social History   Socioeconomic History  . Marital status: Married    Spouse name: Not on file  . Number of children: Not on file  . Years of education: Not on file  . Highest education level: Not on file  Social Needs  . Financial resource strain: Not on file  . Food insecurity - worry: Not on file  . Food insecurity - inability: Not on file  . Transportation needs - medical: Not on file  . Transportation needs - non-medical: Not on file  Occupational History  . Not on file  Tobacco Use  . Smoking status: Never Smoker  . Smokeless tobacco: Never Used  Substance and Sexual Activity  . Alcohol use: Yes    Comment: occasionally  . Drug use: No  . Sexual activity: Yes    Birth control/protection: IUD  Other Topics Concern  . Not on file  Social History Narrative   ** Merged History  Encounter **       Works third shift     Review of Systems: See HPI, otherwise negative ROS  Physical Exam: BP 104/62   Pulse 66   Temp (!) 97.4 F (36.3 C) (Tympanic)   Resp 20   Ht 5\' 4"  (1.626 m)   Wt 175 lb (79.4 kg)   SpO2 100%   BMI 30.04 kg/m  General:   Alert,  pleasant and cooperative in NAD Head:  Normocephalic and atraumatic. Neck:  Supple; no masses or thyromegaly. Lungs:  Clear throughout to auscultation.    Heart:  Regular rate and rhythm. Abdomen:  Soft, nontender and nondistended. Normal bowel sounds, without guarding, and without rebound.    Neurologic:  Alert and  oriented x4;  grossly normal neurologically.  Impression/Plan: Rachel Hurst is here for an ERCP to be performed for CBD stone  Risks, benefits, limitations, and alternatives regarding  ERCP have been reviewed with the patient.  Questions have been answered.  All parties agreeable.   Midge Miniumarren Haaris Metallo, MD  04/24/2017, 11:57 AM

## 2017-04-24 NOTE — Anesthesia Postprocedure Evaluation (Signed)
Anesthesia Post Note  Patient: Rachel Hurst  Procedure(s) Performed: ENDOSCOPIC RETROGRADE CHOLANGIOPANCREATOGRAPHY (ERCP) (N/A )  Patient location during evaluation: Endoscopy Anesthesia Type: General Level of consciousness: awake and alert, oriented and patient cooperative Pain management: satisfactory to patient Vital Signs Assessment: post-procedure vital signs reviewed and stable Respiratory status: spontaneous breathing and respiratory function stable Cardiovascular status: blood pressure returned to baseline and stable Postop Assessment: no headache, no backache, patient able to bend at knees, no apparent nausea or vomiting and adequate PO intake Anesthetic complications: no     Last Vitals:  Vitals:   04/24/17 1152 04/24/17 1235  BP: 104/62 113/69  Pulse: 66 83  Resp: 20 14  Temp: (!) 36.3 C (!) 36.3 C  SpO2: 100% 98%    Last Pain:  Vitals:   04/24/17 1235  TempSrc: Tympanic  PainSc:                  Catheryn BaconJosephine H Theresia Pree

## 2017-04-24 NOTE — ED Notes (Signed)
Patient transported to CT 

## 2017-04-24 NOTE — Op Note (Signed)
[facility] Gastroenterology Patient Name: Ramaya Odonohue Procedure Date: 04/24/2017 11:55 AM MRN: [id number] Account #: [id number] Date of Birth: [birth year] Admit Type: Inpatient Age: 42 Room: [facility] ENDO ROOM 4 Gender: Female Note Status: Finalized Procedure:            ERCP Indications:          Bile duct stone(s) Providers:            Midge Minium MD, MD Referring MD:         No Local Md, MD (Referring MD) Medicines:            Propofol per Anesthesia Complications:        No immediate complications. Procedure:            Pre-Anesthesia Assessment:                       - Prior to the procedure, a History and Physical was                        performed, and patient medications and allergies were                        reviewed. The patient's tolerance of previous                        anesthesia was also reviewed. The risks and benefits of                        the procedure and the sedation options and risks were                        discussed with the patient. All questions were                        answered, and informed consent was obtained. Prior                        Anticoagulants: The patient has taken no previous                        anticoagulant or antiplatelet agents. ASA Grade                        Assessment: II - A patient with mild systemic disease.                        After reviewing the risks and benefits, the patient was                        deemed in satisfactory condition to undergo the                        procedure.                       After obtaining informed consent, the scope was passed                        under direct vision. Throughout the procedure, the  patient's blood pressure, pulse, and oxygen saturations                        were monitored continuously. The ERCP was introduced                        through the mouth, and used to inject contrast into and   used to inject contrast into the bile duct. The ERCP                        was accomplished without difficulty. The patient                        tolerated the procedure well. Findings:      A scout film of the abdomen was obtained. Surgical clips, consistent       with a previous cholecystectomy, were seen in the area of the right       upper quadrant of the abdomen. A biliary sphincterotomy had been       performed. The sphincterotomy appeared open. The bile duct was deeply       cannulated with the short-nosed traction sphincterotome. Contrast was       injected. I personally interpreted the bile duct images. There was brisk       flow of contrast through the ducts. Image quality was excellent.       Contrast extended to the entire biliary tree. A wire was passed into the       biliary tree. Biliary sphincterotomy was made with a traction (standard)       sphincterotome using ERBE electrocautery. There was no       post-sphincterotomy bleeding. The biliary tree was swept with a 15 mm       balloon starting at the bifurcation. Sludge was swept from the duct. One       stone was removed. No stones remained. Impression:           - Prior biliary endoscopic sphincterotomy appeared open.                       - Choledocholithiasis was found. Complete removal was                        accomplished by biliary sphincterotomy and balloon                        extraction.                       - A biliary sphincterotomy was performed.                       - The biliary tree was swept. Recommendation:       - Clear liquid diet.                       - Continue present medications. Procedure Code(s):    --- Professional ---                       [phone], Endoscopic retrograde cholangiopancreatography                        (ERCP); with removal  of calculi/debris from                        biliary/pancreatic duct(s)                       [id number], Endoscopic retrograde cholangiopancreatography                         (ERCP); with sphincterotomy/papillotomy                       [id number], Endoscopic catheterization of the biliary ductal                        system, radiological supervision and interpretation Diagnosis Code(s):    --- Professional ---                       K80.50, Calculus of bile duct without cholangitis or                        cholecystitis without obstruction CPT copyright 2016 American Medical Association. All rights reserved. The codes documented in this report are preliminary and upon coder review may  be revised to meet current compliance requirements. Midge Minium MD, MD 04/24/2017 12:31:39 PM This report has been signed electronically. Number of Addenda: 0 Note Initiated On: 04/24/2017 11:55 AM      [facility]

## 2017-04-24 NOTE — Progress Notes (Signed)
Pt transported back to room 216 by transporter, Rachel Hurst. Pt in stable condition. No family at bedside. Pt is alert and oriented. Report called to [facility] RN.

## 2017-04-24 NOTE — ED Notes (Signed)
Pt states her BP "normally runs 90's over 60's"

## 2017-04-24 NOTE — Anesthesia Preprocedure Evaluation (Signed)
Anesthesia Evaluation  Patient identified by MRN, date of birth, ID band Patient awake    Reviewed: Allergy & Precautions, NPO status , Patient's Chart, lab work & pertinent test results  History of Anesthesia Complications Negative for: history of anesthetic complications  Airway Mallampati: III  TM Distance: >3 FB Neck ROM: Full    Dental no notable dental hx.    Pulmonary neg pulmonary ROS, neg sleep apnea, neg COPD,    breath sounds clear to auscultation- rhonchi (-) wheezing      Cardiovascular Exercise Tolerance: Good (-) hypertension(-) CAD, (-) Past MI, (-) Cardiac Stents and (-) CABG  Rhythm:Regular Rate:Normal - Systolic murmurs and - Diastolic murmurs    Neuro/Psych  Headaches, Anxiety    GI/Hepatic negative GI ROS, Neg liver ROS,   Endo/Other  neg diabetesHypothyroidism   Renal/GU negative Renal ROS     Musculoskeletal negative musculoskeletal ROS (+)   Abdominal (+) + obese,   Peds  Hematology negative hematology ROS (+)   Anesthesia Other Findings Past Medical History: No date: Anxiety No date: Frequent headaches No date: Hypothyroidism No date: Migraine 2006: Thyroid disease     Comment:  Low thyroid   Reproductive/Obstetrics                             Anesthesia Physical Anesthesia Plan  ASA: II  Anesthesia Plan: General   Post-op Pain Management:    Induction: Intravenous  PONV Risk Score and Plan: 2 and Propofol infusion  Airway Management Planned: Natural Airway  Additional Equipment:   Intra-op Plan:   Post-operative Plan:   Informed Consent: I have reviewed the patients History and Physical, chart, labs and discussed the procedure including the risks, benefits and alternatives for the proposed anesthesia with the patient or authorized representative who has indicated his/her understanding and acceptance.   Dental advisory given  Plan Discussed  with: CRNA and Anesthesiologist  Anesthesia Plan Comments:         Anesthesia Quick Evaluation

## 2017-04-24 NOTE — ED Notes (Signed)
Pt talking with MRI tech at this time

## 2017-04-24 NOTE — Telephone Encounter (Signed)
Mirena received 01/30/17 

## 2017-04-24 NOTE — H&P (Signed)
[facility] [place] [facility]   PATIENT NAME: Rachel Hurst    MR#:  [id number]  DATE OF BIRTH:  1975-11-05  DATE OF ADMISSION:  04/23/2017  PRIMARY CARE PHYSICIAN: Patient, No Pcp Per   REQUESTING/REFERRING PHYSICIAN: dr York Cerise  CHIEF COMPLAINT:   Abdominal pain HISTORY OF PRESENT ILLNESS:  Rachel Hurst  is a 42 y.o. female with a known history of depression, hypothyroidism and migraine headaches who presents today due to midepigastric abdominal pain. Patient reports 5 out of 10 mid epigastric abdominal pain that radiates to back. She also complains of nausea vomiting. She has had decreased appetite since the pain started. The pain started 2-3 days ago. She describes the pain as sharp and aching. The last about 15 minutes. She denies fever, chills, shortness of breath, chest pain. No exacerbating or relieving factors.  PAST MEDICAL HISTORY:   Past Medical History:  Diagnosis Date  . Anxiety   . Frequent headaches   . Hypothyroidism   . Migraine   . Thyroid disease 2006   Low thyroid    PAST SURGICAL HISTORY:   Past Surgical History:  Procedure Laterality Date  . CHOLECYSTECTOMY N/A 09/18/2015   Procedure: LAPAROSCOPIC CHOLECYSTECTOMY WITH INTRAOPERATIVE CHOLANGIOGRAM and exploration common bile duct;  Surgeon: Leafy Ro, MD;  Location: [facility] ORS;  Service: General;  Laterality: N/A;  . ERCP N/A 09/17/2015   Procedure: ENDOSCOPIC RETROGRADE CHOLANGIOPANCREATOGRAPHY (ERCP);  Surgeon: Midge Minium, MD;  Location: [facility] ENDOSCOPY;  Service: Endoscopy;  Laterality: N/A;  . TONSILLECTOMY AND ADENOIDECTOMY  1976    SOCIAL HISTORY:   Social History   Tobacco Use  . Smoking status: Never Smoker  . Smokeless tobacco: Never Used  Substance Use Topics  . Alcohol use: Yes    Comment: occasionally    FAMILY HISTORY:   Family History  Problem Relation Age of Onset  . Hyperlipidemia Mother   . Hyperlipidemia Sister   . Diabetes Sister      DRUG ALLERGIES:  No Known Allergies  REVIEW OF SYSTEMS:   Review of Systems  Constitutional: Negative.  Negative for chills, fever and malaise/fatigue.  HENT: Negative.  Negative for ear discharge, ear pain, hearing loss, nosebleeds and sore throat.   Eyes: Negative.  Negative for blurred vision and pain.  Respiratory: Negative.  Negative for cough, hemoptysis, shortness of breath and wheezing.   Cardiovascular: Negative.  Negative for chest pain, palpitations and leg swelling.  Gastrointestinal: Positive for abdominal pain, nausea and vomiting. Negative for blood in stool and diarrhea.  Genitourinary: Negative.  Negative for dysuria.  Musculoskeletal: Negative.  Negative for back pain.  Skin: Negative.   Neurological: Negative for dizziness, tremors, speech change, focal weakness, seizures and headaches.  Endo/Heme/Allergies: Negative.  Does not bruise/bleed easily.  Psychiatric/Behavioral: Negative.  Negative for depression, hallucinations and suicidal ideas.    MEDICATIONS AT HOME:   Prior to Admission medications   Medication Sig Start Date End Date Taking? Authorizing Provider  diclofenac (VOLTAREN) 75 MG EC tablet Take 75 mg by mouth as needed (migraines).   Yes [provider]  DULoxetine (CYMBALTA) 20 MG capsule Take 40 mg by mouth daily.    Yes [provider]  SYNTHROID 175 MCG tablet Take 175 mcg by mouth daily before breakfast.  01/08/17  Yes [provider]  ZOMIG 5 MG nasal solution Place 1 spray into both nostrils as needed for migraine.   Yes [provider]  escitalopram (LEXAPRO) 10 MG tablet TAKE 1 TABLET (10  MG TOTAL) BY MOUTH DAILY. Patient not taking: Reported on 01/30/2017 02/17/15   Carollee Leitz, RN  escitalopram (LEXAPRO) 10 MG tablet Take 10 mg by mouth daily.    [provider]  levothyroxine (SYNTHROID, LEVOTHROID) 200 MCG tablet Take 1 tablet (200 mcg total) by mouth daily. Patient not taking: Reported on  04/24/2017 08/04/15   Tommie Sams, DO  methocarbamol (ROBAXIN-750) 750 MG tablet Take 1 tablet (750 mg total) by mouth 4 (four) times daily. Patient not taking: Reported on 01/30/2017 12/22/15   Joni Reining, PA-C  methylPREDNISolone (MEDROL DOSEPAK) 4 MG TBPK tablet Take Tapered dose as directed Patient not taking: Reported on 01/30/2017 12/22/15   Joni Reining, PA-C      VITAL SIGNS:  Blood pressure 105/76, pulse 81, temperature 98.3 F (36.8 C), temperature source Oral, resp. rate 16, height 5\' 4"  (1.626 m), weight 79.4 kg (175 lb), SpO2 99 %.  PHYSICAL EXAMINATION:   Physical Exam  Constitutional: She is oriented to person, place, and time and well-developed, well-nourished, and in no distress. No distress.  HENT:  Head: Normocephalic.  Eyes: No scleral icterus.  Neck: Normal range of motion. Neck supple. No JVD present. No tracheal deviation present.  Cardiovascular: Normal rate, regular rhythm and normal heart sounds. Exam reveals no gallop and no friction rub.  No murmur heard. Pulmonary/Chest: Effort normal and breath sounds normal. No respiratory distress. She has no wheezes. She has no rales. She exhibits no tenderness.  Abdominal: Soft. Bowel sounds are normal. She exhibits no distension and no mass. There is no tenderness. There is no rebound and no guarding.  Musculoskeletal: Normal range of motion. She exhibits no edema.  Neurological: She is alert and oriented to person, place, and time.  Skin: Skin is warm. No rash noted. No erythema.  Psychiatric: Affect and judgment normal.      LABORATORY PANEL:   CBC Recent Labs  Lab 04/23/17 2311  WBC 12.5*  HGB 12.9  HCT 38.8  PLT 283   ------------------------------------------------------------------------------------------------------------------  Chemistries  Recent Labs  Lab 04/23/17 2311  NA 138  K 4.3  CL 107  CO2 22  GLUCOSE 149*  BUN 12  CREATININE 0.87  CALCIUM 8.9  AST 66*  ALT 39  ALKPHOS  57  BILITOT 0.5   ------------------------------------------------------------------------------------------------------------------  Cardiac Enzymes Recent Labs  Lab 04/23/17 2311  TROPONINI <0.03   ------------------------------------------------------------------------------------------------------------------  RADIOLOGY:  Ct Abdomen Pelvis W Contrast  Result Date: 04/24/2017 CLINICAL DATA:  42 year old female with acute abdominal pain. EXAM: CT ABDOMEN AND PELVIS WITH CONTRAST TECHNIQUE: Multidetector CT imaging of the abdomen and pelvis was performed using the standard protocol following bolus administration of intravenous contrast. CONTRAST:  ISOVUE-300 IOPAMIDOL (ISOVUE-300) INJECTION 61% COMPARISON:  Lumbar spine radiograph dated 12/22/2015 FINDINGS: Lower chest: There is a 4 mm right lower lobe subpleural nodule (series 2, image 5). A nodule in the left lower lobe measures up to 12 mm in length. The visualized lung bases are otherwise clear. No intra-abdominal free air or free fluid. Hepatobiliary: There is a 1.5 x 0.8 cm focal lobular subcapsular enhancement in the right lobe of the liver (series 2, image 18) which is not well characterized but may represent a focal vascular shunting or a small flash filling hemangioma. MRI may provide better characterisation. Cholecystectomy. No intrahepatic biliary ductal dilatation. There is a 10 mm focus of high attenuation within the central CBD which may represent retained calcified stone versus reflux of contrast from the duodenum.  MRCP may provide better evaluation. Pancreas: No inflammatory changes or dilatation of the pancreatic duct. Spleen: Normal in size without focal abnormality. Adrenals/Urinary Tract: There is a focal area of parenchymal atrophy and scarring in the inferior pole of the left kidney. The kidneys are otherwise unremarkable. The visualized ureters and urinary bladder appear unremarkable. Stomach/Bowel: There is no bowel  obstruction or active inflammation. Moderate stool noted throughout the colon. The appendix is normal. Vascular/Lymphatic: No significant vascular findings are present. No enlarged abdominal or pelvic lymph nodes. Reproductive: The uterus is anteverted and grossly unremarkable. An intrauterine device is noted. The ovaries are grossly unremarkable as visualized. There is a corpus luteum in the right ovary. Other: None Musculoskeletal: No acute or significant osseous findings. IMPRESSION: 1. No acute intra-abdominal or pelvic pathology. No bowel obstruction or active inflammation. Normal appendix. 2. Status post prior cholecystectomy. A 10 mm high attenuating focus in the central CBD may represent a retained calcified stone versus reflux of oral contrast. MRI may provide better evaluation. 3. Focal subcapsular enhancement in the right lobe of the liver, not characterized, possibly a small vascular shunting or hemangioma. MRI may provide better characterization. 4. Bilateral pulmonary nodules. Non-contrast chest CT at 3-6 months is recommended. If the nodules are stable at time of repeat CT, then future CT at 18-24 months (from today's scan) is considered optional for low-risk patients, but is recommended for high-risk patients. This recommendation follows the consensus statement: Guidelines for Management of Incidental Pulmonary Nodules Detected on CT Images: From the Fleischner Society 2017; Radiology 2017; 284:228-243. Electronically Signed   By: Elgie Collard M.D.   On: 04/24/2017 03:59   Mr 3d Recon At Scanner  Result Date: 04/24/2017 CLINICAL DATA:  Epigastric pain intermittently over the last 3 days with nausea. EXAM: MRI ABDOMEN WITHOUT AND WITH CONTRAST (INCLUDING MRCP) TECHNIQUE: Multiplanar multisequence MR imaging of the abdomen was performed both before and after the administration of intravenous contrast. Heavily T2-weighted images of the biliary and pancreatic ducts were obtained, and  three-dimensional MRCP images were rendered by post processing. CONTRAST:  16mL MULTIHANCE GADOBENATE DIMEGLUMINE 529 MG/ML IV SOLN COMPARISON:  04/24/2017 FINDINGS: Lower chest: The small bilateral pulmonary nodules seen in the lung bases are again observed. Hepatobiliary: The common hepatic duct measures 8 mm in diameter and the common bile duct likewise measures 8 mm in diameter, with a slightly distended cystic duct remnant. In the distal common bile duct there is a 1.1 by 0.5 cm stone apparent on image 15/10. This corresponds to the calcification seen on CT. Posteriorly in the right hepatic lobe there is a 1.5 by 0.6 cm focus of subcapsular enhancement with indistinct anterior margin which becomes less apparent but still visible on delayed images, quite likely a small peripheral vascular malformation or potentially a small indistinct hemangioma, although the area does not demonstrate high precontrast T2 signal. Pancreas:  Unremarkable Spleen:  Unremarkable Adrenals/Urinary Tract:  Unremarkable Stomach/Bowel: Unremarkable Vascular/Lymphatic:  Unremarkable Other:  No supplemental non-categorized findings. Musculoskeletal: Unremarkable IMPRESSION: 1. 1.1 by 0.5 cm distal common bile duct stone compatible with choledocholithiasis. The common hepatic duct and common bile duct both measure about 8 mm in diameter, which could be related to low-grade obstruction from the stone or physiologic dilatation associated with cholecystectomy. 2. The small peripheral focal enhancement posteriorly in the right hepatic lobe is again observed, seen on arterial phase images and on later delayed phase images, probably due to a small peripheral vascular malformation or less likely a small atypical hemangioma  based on morphology and enhancement. 3. Right basilar and left basilar individual pulmonary nodules are observed, and surveillance recommendations from the CT abdomen are re-emphasized. Electronically Signed   By: Gaylyn Rong M.D.   On: 04/24/2017 07:15   Mr Abdomen Mrcp Vivien Rossetti Contast  Result Date: 04/24/2017 CLINICAL DATA:  Epigastric pain intermittently over the last 3 days with nausea. EXAM: MRI ABDOMEN WITHOUT AND WITH CONTRAST (INCLUDING MRCP) TECHNIQUE: Multiplanar multisequence MR imaging of the abdomen was performed both before and after the administration of intravenous contrast. Heavily T2-weighted images of the biliary and pancreatic ducts were obtained, and three-dimensional MRCP images were rendered by post processing. CONTRAST:  16mL MULTIHANCE GADOBENATE DIMEGLUMINE 529 MG/ML IV SOLN COMPARISON:  04/24/2017 FINDINGS: Lower chest: The small bilateral pulmonary nodules seen in the lung bases are again observed. Hepatobiliary: The common hepatic duct measures 8 mm in diameter and the common bile duct likewise measures 8 mm in diameter, with a slightly distended cystic duct remnant. In the distal common bile duct there is a 1.1 by 0.5 cm stone apparent on image 15/10. This corresponds to the calcification seen on CT. Posteriorly in the right hepatic lobe there is a 1.5 by 0.6 cm focus of subcapsular enhancement with indistinct anterior margin which becomes less apparent but still visible on delayed images, quite likely a small peripheral vascular malformation or potentially a small indistinct hemangioma, although the area does not demonstrate high precontrast T2 signal. Pancreas:  Unremarkable Spleen:  Unremarkable Adrenals/Urinary Tract:  Unremarkable Stomach/Bowel: Unremarkable Vascular/Lymphatic:  Unremarkable Other:  No supplemental non-categorized findings. Musculoskeletal: Unremarkable IMPRESSION: 1. 1.1 by 0.5 cm distal common bile duct stone compatible with choledocholithiasis. The common hepatic duct and common bile duct both measure about 8 mm in diameter, which could be related to low-grade obstruction from the stone or physiologic dilatation associated with cholecystectomy. 2. The small peripheral  focal enhancement posteriorly in the right hepatic lobe is again observed, seen on arterial phase images and on later delayed phase images, probably due to a small peripheral vascular malformation or less likely a small atypical hemangioma based on morphology and enhancement. 3. Right basilar and left basilar individual pulmonary nodules are observed, and surveillance recommendations from the CT abdomen are re-emphasized. Electronically Signed   By: Gaylyn Rong M.D.   On: 04/24/2017 07:15    EKG:  Normal sinus rhythm no ST elevation or depression  IMPRESSION AND PLAN:   42 year old female status post cholecystectomy with history of hypothyroidism and depression who presents with midepigastric abdominal pain.  1. Retained stone in the common bile duct with abdominal pain/Choledocholelithiasis:  ER M.D. has spoken with GI consultant. Plan for ERCP Nothing by mouth for now  2. Depression: Continue Cymbalta/Lexapro  3. Hypothyroidism: Continue Synthroid    All the records are reviewed and case discussed with ED provider. Management plans discussed with the patient and she is in agreement  CODE STATUS: full  TOTAL TIME TAKING CARE OF THIS PATIENT: 42 minutes.    Undrea Shipes M.D on 04/24/2017 at 8:20 AM  Between 7am to 6pm - Pager - [phone]  After 6pm go to [email or web address] - password [employer]  [facility]  Office  [phone]  CC: Primary care physician; Patient, No Pcp Per

## 2017-04-24 NOTE — Transfer of Care (Signed)
Immediate Anesthesia Transfer of Care Note  Patient: Rachel Hurst  Procedure(s) Performed: ENDOSCOPIC RETROGRADE CHOLANGIOPANCREATOGRAPHY (ERCP) (N/A )  Patient Location: PACU  Anesthesia Type:General  Level of Consciousness: drowsy and patient cooperative  Airway & Oxygen Therapy: Patient Spontanous Breathing and Patient connected to nasal cannula oxygen  Post-op Assessment: Report given to RN, Post -op Vital signs reviewed and stable and Patient moving all extremities X 4  Post vital signs: Reviewed and stable  Last Vitals:  Vitals:   04/24/17 1152 04/24/17 1235  BP: 104/62 113/69  Pulse: 66 83  Resp: 20 14  Temp: (!) 36.3 C (!) 36.3 C  SpO2: 100% 98%    Last Pain:  Vitals:   04/24/17 1235  TempSrc: Tympanic  PainSc:          Complications: No apparent anesthesia complications

## 2017-04-24 NOTE — ED Notes (Addendum)
Pt made aware of NPO status at this time.

## 2017-04-24 NOTE — ED Provider Notes (Signed)
[facility] Emergency Department Provider Note  ____________________________________________   First MD Initiated Contact with Patient 04/24/17 [phone]     (approximate)  I have reviewed the triage vital signs and the nursing notes.   HISTORY  Chief Complaint Abdominal Pain    HPI Rachel Hurst is a 42 y.o. female whose medical history includes cholecystectomy about a year and a half ago who presents for evaluation of episodic epigastric chest pain that radiates through to the back (or vice versa).  She reports that it feels similar to when she was having pain prior to her cholecystectomy.  There is no specific relation to eating; she has had at least 3 episodes over the last couple of days and 2 of them occurred within a couple of hours of eating a meal but when the pain occurred tonight she had not had anything to eat recently.  She describes the pain as both sharp and aching and radiating.  It lasts for anywhere from 15-30 minutes.  She thinks she had some associated nausea but no vomiting.  She denies any lower abdominal pain and diarrhea.  She denies fever/chills, chest pain, shortness of breath, dysuria.  Nothing in particular makes it better or worse.  Past Medical History:  Diagnosis Date  . Anxiety   . Frequent headaches   . Hypothyroidism   . Migraine   . Thyroid disease 2006   Low thyroid    Patient Active Problem List   Diagnosis Date Noted  . Cholelithiasis 04/24/2017  . Menstrual migraine without status migrainosus, not intractable 10/18/2016  . Hypothyroidism 04/27/2014  . Screening for breast cancer 04/27/2014  . Lump of breast, left 04/27/2014    Past Surgical History:  Procedure Laterality Date  . CHOLECYSTECTOMY N/A 09/18/2015   Procedure: LAPAROSCOPIC CHOLECYSTECTOMY WITH INTRAOPERATIVE CHOLANGIOGRAM and exploration common bile duct;  Surgeon: Leafy Ro, MD;  Location: [facility] ORS;  Service: General;  Laterality: N/A;  .  ERCP N/A 09/17/2015   Procedure: ENDOSCOPIC RETROGRADE CHOLANGIOPANCREATOGRAPHY (ERCP);  Surgeon: Midge Minium, MD;  Location: [facility] ENDOSCOPY;  Service: Endoscopy;  Laterality: N/A;  . TONSILLECTOMY AND ADENOIDECTOMY  1976    Prior to Admission medications   Medication Sig Start Date End Date Taking? Authorizing Provider  diclofenac (VOLTAREN) 75 MG EC tablet Take 75 mg by mouth as needed (migraines).   Yes [provider]  DULoxetine (CYMBALTA) 20 MG capsule Take 40 mg by mouth daily.    Yes [provider]  SYNTHROID 175 MCG tablet Take 175 mcg by mouth daily before breakfast.  01/08/17  Yes [provider]  ZOMIG 5 MG nasal solution Place 1 spray into both nostrils as needed for migraine.   Yes [provider]  escitalopram (LEXAPRO) 10 MG tablet TAKE 1 TABLET (10 MG TOTAL) BY MOUTH DAILY. Patient not taking: Reported on 01/30/2017 02/17/15   Carollee Leitz, RN  escitalopram (LEXAPRO) 10 MG tablet Take 10 mg by mouth daily.    [provider]  levothyroxine (SYNTHROID, LEVOTHROID) 200 MCG tablet Take 1 tablet (200 mcg total) by mouth daily. Patient not taking: Reported on 04/24/2017 08/04/15   Tommie Sams, DO  methocarbamol (ROBAXIN-750) 750 MG tablet Take 1 tablet (750 mg total) by mouth 4 (four) times daily. Patient not taking: Reported on 01/30/2017 12/22/15   Joni Reining, PA-C  methylPREDNISolone (MEDROL DOSEPAK) 4 MG TBPK tablet Take Tapered dose as directed Patient not taking: Reported on 01/30/2017 12/22/15   Joni Reining,  PA-C    Allergies Patient has no known allergies.  Family History  Problem Relation Age of Onset  . Hyperlipidemia Mother   . Hyperlipidemia Sister   . Diabetes Sister     Social History Social History   Tobacco Use  . Smoking status: Never Smoker  . Smokeless tobacco: Never Used  Substance Use Topics  . Alcohol use: Yes    Comment: occasionally  . Drug use: No    Review of Systems Constitutional:  No fever/chills Eyes: No visual changes. ENT: No sore throat. Cardiovascular: Denies chest pain. Respiratory: Denies shortness of breath. Gastrointestinal: Episodic epigastric abd pain radiating front/back or back/front for several days.  Nausea, no vomiting, no diarrhea. Genitourinary: Negative for dysuria. Musculoskeletal: Negative for neck pain.  Negative for back pain. Integumentary: Negative for rash. Neurological: Negative for headaches, focal weakness or numbness.   ____________________________________________   PHYSICAL EXAM:  VITAL SIGNS: ED Triage Vitals  Enc Vitals Group     BP 04/23/17 2313 126/69     Pulse Rate 04/23/17 2313 84     Resp 04/23/17 2313 18     Temp 04/23/17 2313 98.3 F (36.8 C)     Temp Source 04/23/17 2313 Oral     SpO2 04/23/17 2313 100 %     Weight 04/23/17 2314 79.4 kg (175 lb)     Height 04/23/17 2314 1.626 m (5\' 4" )     Head Circumference --      Peak Flow --      Pain Score 04/23/17 2314 1     Pain Loc --      Pain Edu? --      Excl. in GC? --     Constitutional: Alert and oriented. Well appearing and in no acute distress. Eyes: Conjunctivae are normal.  Head: Atraumatic. Nose: No congestion/rhinnorhea. Mouth/Throat: Mucous membranes are moist. Neck: No stridor.  No meningeal signs.   Cardiovascular: Normal rate, regular rhythm. Good peripheral circulation. Grossly normal heart sounds. Respiratory: Normal respiratory effort.  No retractions. Lungs CTAB. Gastrointestinal: Soft with mild to moderate tenderness to palpation of the epigastrium.  Negative Murphy sign and no right upper quadrant tenderness.  No lower abdominal tenderness, no rebound and no guarding Musculoskeletal: No lower extremity tenderness nor edema. No gross deformities of extremities. Neurologic:  Normal speech and language. No gross focal neurologic deficits are appreciated.  Skin:  Skin is warm, dry and intact. No rash noted. Psychiatric: Mood and affect are  normal. Speech and behavior are normal.  ____________________________________________   LABS (all labs ordered are listed, but only abnormal results are displayed)  Labs Reviewed  COMPREHENSIVE METABOLIC PANEL - Abnormal; Notable for the following components:      Result Value   Glucose, Bld 149 (*)    AST 66 (*)    All other components within normal limits  CBC - Abnormal; Notable for the following components:   WBC 12.5 (*)    All other components within normal limits  URINALYSIS, COMPLETE (UACMP) WITH MICROSCOPIC - Abnormal; Notable for the following components:   Color, Urine YELLOW (*)    APPearance HAZY (*)    Hgb urine dipstick SMALL (*)    Leukocytes, UA SMALL (*)    Bacteria, UA RARE (*)    Squamous Epithelial / LPF 0-5 (*)    All other components within normal limits  LIPASE, BLOOD  TROPONIN I  POC URINE PREG, ED  POCT PREGNANCY, URINE   ____________________________________________  EKG  ED ECG REPORT  ILoleta Rose, the attending physician, personally viewed and interpreted this ECG.  Date: 04/23/2017 EKG Time: 23: 15 Rate: 78 Rhythm: normal sinus rhythm QRS Axis: normal Intervals: normal ST/T Wave abnormalities: normal Narrative Interpretation: no evidence of acute ischemia  ____________________________________________  RADIOLOGY   ED MD interpretation: CT scan is concerning for retained stone in the common bile duct.  ERCP is consistent with choledocholithiasis.   Official radiology report(s): Ct Abdomen Pelvis W Contrast  Result Date: 04/24/2017 CLINICAL DATA:  42 year old female with acute abdominal pain. EXAM: CT ABDOMEN AND PELVIS WITH CONTRAST TECHNIQUE: Multidetector CT imaging of the abdomen and pelvis was performed using the standard protocol following bolus administration of intravenous contrast. CONTRAST:  ISOVUE-300 IOPAMIDOL (ISOVUE-300) INJECTION 61% COMPARISON:  Lumbar spine radiograph dated 12/22/2015 FINDINGS: Lower chest:  There is a 4 mm right lower lobe subpleural nodule (series 2, image 5). A nodule in the left lower lobe measures up to 12 mm in length. The visualized lung bases are otherwise clear. No intra-abdominal free air or free fluid. Hepatobiliary: There is a 1.5 x 0.8 cm focal lobular subcapsular enhancement in the right lobe of the liver (series 2, image 18) which is not well characterized but may represent a focal vascular shunting or a small flash filling hemangioma. MRI may provide better characterisation. Cholecystectomy. No intrahepatic biliary ductal dilatation. There is a 10 mm focus of high attenuation within the central CBD which may represent retained calcified stone versus reflux of contrast from the duodenum. MRCP may provide better evaluation. Pancreas: No inflammatory changes or dilatation of the pancreatic duct. Spleen: Normal in size without focal abnormality. Adrenals/Urinary Tract: There is a focal area of parenchymal atrophy and scarring in the inferior pole of the left kidney. The kidneys are otherwise unremarkable. The visualized ureters and urinary bladder appear unremarkable. Stomach/Bowel: There is no bowel obstruction or active inflammation. Moderate stool noted throughout the colon. The appendix is normal. Vascular/Lymphatic: No significant vascular findings are present. No enlarged abdominal or pelvic lymph nodes. Reproductive: The uterus is anteverted and grossly unremarkable. An intrauterine device is noted. The ovaries are grossly unremarkable as visualized. There is a corpus luteum in the right ovary. Other: None Musculoskeletal: No acute or significant osseous findings. IMPRESSION: 1. No acute intra-abdominal or pelvic pathology. No bowel obstruction or active inflammation. Normal appendix. 2. Status post prior cholecystectomy. A 10 mm high attenuating focus in the central CBD may represent a retained calcified stone versus reflux of oral contrast. MRI may provide better evaluation. 3.  Focal subcapsular enhancement in the right lobe of the liver, not characterized, possibly a small vascular shunting or hemangioma. MRI may provide better characterization. 4. Bilateral pulmonary nodules. Non-contrast chest CT at 3-6 months is recommended. If the nodules are stable at time of repeat CT, then future CT at 18-24 months (from today's scan) is considered optional for low-risk patients, but is recommended for high-risk patients. This recommendation follows the consensus statement: Guidelines for Management of Incidental Pulmonary Nodules Detected on CT Images: From the Fleischner Society 2017; Radiology 2017; 284:228-243. Electronically Signed   By: Elgie Collard M.D.   On: 04/24/2017 03:59   Mr 3d Recon At Scanner  Result Date: 04/24/2017 CLINICAL DATA:  Epigastric pain intermittently over the last 3 days with nausea. EXAM: MRI ABDOMEN WITHOUT AND WITH CONTRAST (INCLUDING MRCP) TECHNIQUE: Multiplanar multisequence MR imaging of the abdomen was performed both before and after the administration of intravenous contrast. Heavily T2-weighted images of the biliary and pancreatic ducts  were obtained, and three-dimensional MRCP images were rendered by post processing. CONTRAST:  16mL MULTIHANCE GADOBENATE DIMEGLUMINE 529 MG/ML IV SOLN COMPARISON:  04/24/2017 FINDINGS: Lower chest: The small bilateral pulmonary nodules seen in the lung bases are again observed. Hepatobiliary: The common hepatic duct measures 8 mm in diameter and the common bile duct likewise measures 8 mm in diameter, with a slightly distended cystic duct remnant. In the distal common bile duct there is a 1.1 by 0.5 cm stone apparent on image 15/10. This corresponds to the calcification seen on CT. Posteriorly in the right hepatic lobe there is a 1.5 by 0.6 cm focus of subcapsular enhancement with indistinct anterior margin which becomes less apparent but still visible on delayed images, quite likely a small peripheral vascular  malformation or potentially a small indistinct hemangioma, although the area does not demonstrate high precontrast T2 signal. Pancreas:  Unremarkable Spleen:  Unremarkable Adrenals/Urinary Tract:  Unremarkable Stomach/Bowel: Unremarkable Vascular/Lymphatic:  Unremarkable Other:  No supplemental non-categorized findings. Musculoskeletal: Unremarkable IMPRESSION: 1. 1.1 by 0.5 cm distal common bile duct stone compatible with choledocholithiasis. The common hepatic duct and common bile duct both measure about 8 mm in diameter, which could be related to low-grade obstruction from the stone or physiologic dilatation associated with cholecystectomy. 2. The small peripheral focal enhancement posteriorly in the right hepatic lobe is again observed, seen on arterial phase images and on later delayed phase images, probably due to a small peripheral vascular malformation or less likely a small atypical hemangioma based on morphology and enhancement. 3. Right basilar and left basilar individual pulmonary nodules are observed, and surveillance recommendations from the CT abdomen are re-emphasized. Electronically Signed   By: Gaylyn Rong M.D.   On: 04/24/2017 07:15   Mr Abdomen Mrcp Vivien Rossetti Contast  Result Date: 04/24/2017 CLINICAL DATA:  Epigastric pain intermittently over the last 3 days with nausea. EXAM: MRI ABDOMEN WITHOUT AND WITH CONTRAST (INCLUDING MRCP) TECHNIQUE: Multiplanar multisequence MR imaging of the abdomen was performed both before and after the administration of intravenous contrast. Heavily T2-weighted images of the biliary and pancreatic ducts were obtained, and three-dimensional MRCP images were rendered by post processing. CONTRAST:  16mL MULTIHANCE GADOBENATE DIMEGLUMINE 529 MG/ML IV SOLN COMPARISON:  04/24/2017 FINDINGS: Lower chest: The small bilateral pulmonary nodules seen in the lung bases are again observed. Hepatobiliary: The common hepatic duct measures 8 mm in diameter and the common bile  duct likewise measures 8 mm in diameter, with a slightly distended cystic duct remnant. In the distal common bile duct there is a 1.1 by 0.5 cm stone apparent on image 15/10. This corresponds to the calcification seen on CT. Posteriorly in the right hepatic lobe there is a 1.5 by 0.6 cm focus of subcapsular enhancement with indistinct anterior margin which becomes less apparent but still visible on delayed images, quite likely a small peripheral vascular malformation or potentially a small indistinct hemangioma, although the area does not demonstrate high precontrast T2 signal. Pancreas:  Unremarkable Spleen:  Unremarkable Adrenals/Urinary Tract:  Unremarkable Stomach/Bowel: Unremarkable Vascular/Lymphatic:  Unremarkable Other:  No supplemental non-categorized findings. Musculoskeletal: Unremarkable IMPRESSION: 1. 1.1 by 0.5 cm distal common bile duct stone compatible with choledocholithiasis. The common hepatic duct and common bile duct both measure about 8 mm in diameter, which could be related to low-grade obstruction from the stone or physiologic dilatation associated with cholecystectomy. 2. The small peripheral focal enhancement posteriorly in the right hepatic lobe is again observed, seen on arterial phase images and on later delayed  phase images, probably due to a small peripheral vascular malformation or less likely a small atypical hemangioma based on morphology and enhancement. 3. Right basilar and left basilar individual pulmonary nodules are observed, and surveillance recommendations from the CT abdomen are re-emphasized. Electronically Signed   By: Gaylyn Rong M.D.   On: 04/24/2017 07:15    ____________________________________________   PROCEDURES  Critical Care performed: No   Procedure(s) performed:   Procedures   ____________________________________________   INITIAL IMPRESSION / ASSESSMENT AND PLAN / ED COURSE  As part of my medical decision making, I reviewed the  following data within the electronic MEDICAL RECORD NUMBER Nursing notes reviewed and incorporated, Labs reviewed , Old chart reviewed and A consult was requested and obtained from this/these consultant(s) Gastroenterology    Differential diagnosis includes, but is not limited to, biliary disease (in a post-cholecystectomy patient, this would primarily be a retained stone), intrathoracic causes for epigastric abdominal pain including ACS, gastritis, duodenitis, pancreatitis, small bowel or large bowel obstruction, abdominal aortic aneurysm, hernia, and gastritis.  The patient is well-appearing with normal vitals, afebrile, in no acute distress.  She does have some tenderness to palpation of the epigastrium.  Her lipase is not back yet but her other lab work is reassuring except for a very mild leukocytosis.  No strong indication for ultrasound given that the patient is post cholecystectomy.  I will wait for the results of the lipase and then reassess the need for additional imaging.  Clinical Course as of Apr 25 743  Tue Apr 24, 2017  0247 Lipase: 38 [CF]  0400 Pulmonary nodules, possible hemangioma in liver, and possible retained stone in CBD.  Will discuss with radiology for further details. CT ABDOMEN PELVIS W CONTRAST [CF]  0413 Discussed case by phone with Dr. Gwenyth Bender, urologist who interpreted the CT scan.  He feels the hyperattenuation he is seeing in the common bile duct represents a retained stone and additionally recommends MRCP for further characterization.  I discussed with the patient and explained that we could admit her to the hospital for gastroenterology consult but I explained that I am quite confident that they will order the MRCP anyway because an ERCP is invasive with other potential risks of sedation, perforation, etc., and they are much more likely to order the MRCP first to confirm the diagnosis before putting her through sedation and an invasive procedure.  I offered to obtain the  MRCP while she is in the emergency department to confirm the diagnosis and she agrees with the recommendation which I think is appropriate and reasonable.  I confirmed with Dr. Gwenyth Bender the appropriate imaging study to order (MRCP with contrast per his recommendations) and have put in the order.  [CF]  0740 MRCP is consistent with choledocholithiasis.  I spoke with phone with Dr. Tobi Bastos and updated him.  He will see the patient in the hospital and recommended admission to the hospitalist service for ERCP either today or tomorrow.  I updated the patient as well.  She had been ordered as n.p.o. since my initial evaluation of the patient last night but she did have a ginger ale at the bedside, unclear where this came from.  She was told to remain n.p.o. until further notice.  I am starting IV fluids.  I discussed the case in person with Dr. Sheryle Hail and by phone with Dr. Juliene Pina with the hospitalist service.  [CF]  [id number] I also advised the patient of the pulmonary nodules and recommended that she follow-up as  an outpatient for repeat imaging in 3-6 months as per radiology recommendation.  [CF]    Clinical Course User Index [CF] Loleta Rose, MD    ____________________________________________  FINAL CLINICAL IMPRESSION(S) / ED DIAGNOSES  Final diagnoses:  Choledocholithiasis  Pulmonary nodules     MEDICATIONS GIVEN DURING THIS VISIT:  Medications  0.9 %  sodium chloride infusion (not administered)  iopamidol (ISOVUE-300) 61 % injection 30 mL (30 mLs Oral Contrast Given 04/24/17 0230)  morphine 4 MG/ML injection 4 mg (4 mg Intravenous Given 04/24/17 0245)  ondansetron (ZOFRAN) injection 4 mg (4 mg Intravenous Given 04/24/17 0245)  iopamidol (ISOVUE-300) 61 % injection 100 mL (100 mLs Intravenous Contrast Given 04/24/17 0338)  gadobenate dimeglumine (MULTIHANCE) injection 20 mL (16 mLs Intravenous Contrast Given 04/24/17 1610)     ED Discharge Orders    None       Note:  This document was  prepared using Dragon voice recognition software and may include unintentional dictation errors.    Loleta Rose, MD 04/24/17 [phone]

## 2017-04-24 NOTE — ED Notes (Signed)
Patient transported to MRI 

## 2017-04-24 NOTE — Anesthesia Post-op Follow-up Note (Signed)
Anesthesia QCDR form completed.        

## 2017-04-24 NOTE — Anesthesia Postprocedure Evaluation (Addendum)
Anesthesia Post Note  Patient: Rachel Hurst  Procedure(s) Performed: ENDOSCOPIC RETROGRADE CHOLANGIOPANCREATOGRAPHY (ERCP) (N/A )  Patient location during evaluation: Endoscopy Anesthesia Type: General Level of consciousness: awake and alert and oriented Pain management: pain level controlled Vital Signs Assessment: post-procedure vital signs reviewed and stable Respiratory status: spontaneous breathing, nonlabored ventilation and respiratory function stable Cardiovascular status: blood pressure returned to baseline and stable Postop Assessment: no signs of nausea or vomiting Anesthetic complications: no     Last Vitals:  Vitals:   04/24/17 1245 04/24/17 1255  BP: (!) 111/58 106/62  Pulse: 79 65  Resp: 14 10  Temp:    SpO2: 100% 100%    Last Pain:  Vitals:   04/24/17 1235  TempSrc: Tympanic  PainSc:                  Jacquese Cassarino

## 2017-04-24 NOTE — ED Notes (Signed)
Pt notified urine sample is needed, pt verbalizes understanding of this 

## 2017-04-24 NOTE — Anesthesia Procedure Notes (Signed)
Date/Time: 04/24/2017 12:00 PM Performed by: Stormy Fabianurtis, Adie Vilar, CRNA Pre-anesthesia Checklist: Patient identified, Emergency Drugs available, Suction available and Patient being monitored Patient Re-evaluated:Patient Re-evaluated prior to induction Oxygen Delivery Method: Nasal cannula Induction Type: IV induction Dental Injury: Teeth and Oropharynx as per pre-operative assessment  Comments: Nasal cannula with etCO2 monitoring

## 2017-04-25 ENCOUNTER — Encounter: Payer: Self-pay | Admitting: Gastroenterology

## 2017-04-25 LAB — CBC
HCT: 36.8 % (ref 35.0–47.0)
Hemoglobin: 12.4 g/dL (ref 12.0–16.0)
MCH: 29.1 pg (ref 26.0–34.0)
MCHC: 33.8 g/dL (ref 32.0–36.0)
MCV: 86.2 fL (ref 80.0–100.0)
PLATELETS: 253 10*3/uL (ref 150–440)
RBC: 4.27 MIL/uL (ref 3.80–5.20)
RDW: 13.8 % (ref 11.5–14.5)
WBC: 12.2 10*3/uL — AB (ref 3.6–11.0)

## 2017-04-25 LAB — BASIC METABOLIC PANEL
Anion gap: 7 (ref 5–15)
BUN: 9 mg/dL (ref 6–20)
CHLORIDE: 105 mmol/L (ref 101–111)
CO2: 24 mmol/L (ref 22–32)
Calcium: 8.3 mg/dL — ABNORMAL LOW (ref 8.9–10.3)
Creatinine, Ser: 0.59 mg/dL (ref 0.44–1.00)
GFR calc Af Amer: >60 mL/min (ref >60)
Glucose, Bld: 127 mg/dL — ABNORMAL HIGH (ref 65–99)
Potassium: 4.2 mmol/L (ref 3.5–5.1)
SODIUM: 136 mmol/L (ref 135–145)

## 2017-04-25 LAB — HIV ANTIBODY (ROUTINE TESTING W REFLEX): HIV Screen 4th Generation wRfx: NONREACTIVE

## 2017-04-25 MED ORDER — FAMOTIDINE 20 MG PO TABS
20.0000 mg | ORAL_TABLET | Freq: Every day | ORAL | Status: DC
  Administered 2017-04-25: 20 mg via ORAL
  Filled 2017-04-25: qty 1

## 2017-04-25 MED ORDER — ALUM & MAG HYDROXIDE-SIMETH 200-200-20 MG/5ML PO SUSP
30.0000 mL | Freq: Once | ORAL | Status: AC
  Administered 2017-04-25: 30 mL via ORAL
  Filled 2017-04-25: qty 30

## 2017-04-25 NOTE — Progress Notes (Signed)
Pt discharged per MD order. Discharge instructions reviewed with pt. IV removed. Pt spoke with Dr. Tobi BastosAnna prior to discharge. All questions answered to pt satisfaction. Pt chose to walk to car.

## 2017-04-25 NOTE — Discharge Summary (Signed)
[facility] [place] [facility]   PATIENT NAME: Rachel Hurst    MR#:  [id number]  DATE OF BIRTH:  Nov 01, 1975  DATE OF ADMISSION:  04/23/2017 ADMITTING PHYSICIAN: Adrian Saran, MD  DATE OF DISCHARGE: 04/25/2017  PRIMARY CARE PHYSICIAN: Patient, No Pcp Per    ADMISSION DIAGNOSIS:  Choledocholithiasis [K80.50] Jaundice [R17] Pulmonary nodules [R91.8]  DISCHARGE DIAGNOSIS:  Active Problems:   Cholelithiasis   Choledocholithiasis   SECONDARY DIAGNOSIS:   Past Medical History:  Diagnosis Date  . Anxiety   . Frequent headaches   . Hypothyroidism   . Migraine   . Thyroid disease 2006   Low thyroid    HOSPITAL COURSE:   42 year old female status post cholecystectomy with history of hypothyroidism and depression who presents with midepigastric abdominal pain.  1. Retained stone in the common bile duct with abdominal pain/Choledocholelithiasis:  Patient underwent ERCP and CBD stone was removed. She is doing well. She has no abdominal pain.   2. Depression: Continue Cymbalta/Lexapro  3. Hypothyroidism: Continue Synthroid      DISCHARGE CONDITIONS AND DIET:   Stable for discharge home on regular diet  CONSULTS OBTAINED:    DRUG ALLERGIES:  No Known Allergies  DISCHARGE MEDICATIONS:   Allergies as of 04/25/2017   No Known Allergies     Medication List    STOP taking these medications   methocarbamol 750 MG tablet Commonly known as:  ROBAXIN-750   methylPREDNISolone 4 MG Tbpk tablet Commonly known as:  MEDROL DOSEPAK     TAKE these medications   diclofenac 75 MG EC tablet Commonly known as:  VOLTAREN Take 75 mg by mouth as needed (migraines).   DULoxetine 20 MG capsule Commonly known as:  CYMBALTA Take 40 mg by mouth daily.   escitalopram 10 MG tablet Commonly known as:  LEXAPRO Take 10 mg by mouth daily. What changed:  Another medication with the same name was removed. Continue taking this medication, and follow the  directions you see here.   SYNTHROID 175 MCG tablet Generic drug:  levothyroxine Take 175 mcg by mouth daily before breakfast. What changed:  Another medication with the same name was removed. Continue taking this medication, and follow the directions you see here.   ZOMIG 5 MG nasal solution Generic drug:  zolmitriptan Place 1 spray into both nostrils as needed for migraine.         Today   CHIEF COMPLAINT:  Patient without abdominal pain, nausea or vomiting   VITAL SIGNS:  Blood pressure (!) 93/46, pulse 61, temperature 98.2 F (36.8 C), temperature source Oral, resp. rate 18, height 5\' 4"  (1.626 m), weight 79.4 kg (175 lb), SpO2 95 %.   REVIEW OF SYSTEMS:  Review of Systems  Constitutional: Negative.  Negative for chills, fever and malaise/fatigue.  HENT: Negative.  Negative for ear discharge, ear pain, hearing loss, nosebleeds and sore throat.   Eyes: Negative.  Negative for blurred vision and pain.  Respiratory: Negative.  Negative for cough, hemoptysis, shortness of breath and wheezing.   Cardiovascular: Negative.  Negative for chest pain, palpitations and leg swelling.  Gastrointestinal: Negative.  Negative for abdominal pain, blood in stool, diarrhea, nausea and vomiting.  Genitourinary: Negative.  Negative for dysuria.  Musculoskeletal: Negative.  Negative for back pain.  Skin: Negative.   Neurological: Negative for dizziness, tremors, speech change, focal weakness, seizures and headaches.  Endo/Heme/Allergies: Negative.  Does not bruise/bleed easily.  Psychiatric/Behavioral: Negative.  Negative for depression, hallucinations and suicidal ideas.  PHYSICAL EXAMINATION:  GENERAL:  42 y.o.-year-old patient lying in the bed with no acute distress.  NECK:  Supple, no jugular venous distention. No thyroid enlargement, no tenderness.  LUNGS: Normal breath sounds bilaterally, no wheezing, rales,rhonchi  No use of accessory muscles of respiration.  CARDIOVASCULAR:  S1, S2 normal. No murmurs, rubs, or gallops.  ABDOMEN: Soft, non-tender, non-distended. Bowel sounds present. No organomegaly or mass.  EXTREMITIES: No pedal edema, cyanosis, or clubbing.  PSYCHIATRIC: The patient is alert and oriented x 3.  SKIN: No obvious rash, lesion, or ulcer.   DATA REVIEW:   CBC Recent Labs  Lab 04/25/17 0348  WBC 12.2*  HGB 12.4  HCT 36.8  PLT 253    Chemistries  Recent Labs  Lab 04/23/17 2311  04/25/17 0348  NA 138  --  136  K 4.3  --  4.2  CL 107  --  105  CO2 22  --  24  GLUCOSE 149*  --  127*  BUN 12  --  9  CREATININE 0.87   < > 0.59  CALCIUM 8.9  --  8.3*  AST 66*  --   --   ALT 39  --   --   ALKPHOS 57  --   --   BILITOT 0.5  --   --    < > = values in this interval not displayed.    Cardiac Enzymes Recent Labs  Lab 04/23/17 2311  TROPONINI <0.03    Microbiology Results  @[email or web address] @  RADIOLOGY:  Ct Abdomen Pelvis W Contrast  Result Date: 04/24/2017 CLINICAL DATA:  42 year old female with acute abdominal pain. EXAM: CT ABDOMEN AND PELVIS WITH CONTRAST TECHNIQUE: Multidetector CT imaging of the abdomen and pelvis was performed using the standard protocol following bolus administration of intravenous contrast. CONTRAST:  ISOVUE-300 IOPAMIDOL (ISOVUE-300) INJECTION 61% COMPARISON:  Lumbar spine radiograph dated 12/22/2015 FINDINGS: Lower chest: There is a 4 mm right lower lobe subpleural nodule (series 2, image 5). A nodule in the left lower lobe measures up to 12 mm in length. The visualized lung bases are otherwise clear. No intra-abdominal free air or free fluid. Hepatobiliary: There is a 1.5 x 0.8 cm focal lobular subcapsular enhancement in the right lobe of the liver (series 2, image 18) which is not well characterized but may represent a focal vascular shunting or a small flash filling hemangioma. MRI may provide better characterisation. Cholecystectomy. No intrahepatic biliary ductal dilatation. There is a 10 mm focus of  high attenuation within the central CBD which may represent retained calcified stone versus reflux of contrast from the duodenum. MRCP may provide better evaluation. Pancreas: No inflammatory changes or dilatation of the pancreatic duct. Spleen: Normal in size without focal abnormality. Adrenals/Urinary Tract: There is a focal area of parenchymal atrophy and scarring in the inferior pole of the left kidney. The kidneys are otherwise unremarkable. The visualized ureters and urinary bladder appear unremarkable. Stomach/Bowel: There is no bowel obstruction or active inflammation. Moderate stool noted throughout the colon. The appendix is normal. Vascular/Lymphatic: No significant vascular findings are present. No enlarged abdominal or pelvic lymph nodes. Reproductive: The uterus is anteverted and grossly unremarkable. An intrauterine device is noted. The ovaries are grossly unremarkable as visualized. There is a corpus luteum in the right ovary. Other: None Musculoskeletal: No acute or significant osseous findings. IMPRESSION: 1. No acute intra-abdominal or pelvic pathology. No bowel obstruction or active inflammation. Normal appendix. 2. Status post prior cholecystectomy. A 10 mm high attenuating focus  in the central CBD may represent a retained calcified stone versus reflux of oral contrast. MRI may provide better evaluation. 3. Focal subcapsular enhancement in the right lobe of the liver, not characterized, possibly a small vascular shunting or hemangioma. MRI may provide better characterization. 4. Bilateral pulmonary nodules. Non-contrast chest CT at 3-6 months is recommended. If the nodules are stable at time of repeat CT, then future CT at 18-24 months (from today's scan) is considered optional for low-risk patients, but is recommended for high-risk patients. This recommendation follows the consensus statement: Guidelines for Management of Incidental Pulmonary Nodules Detected on CT Images: From the Fleischner  Society 2017; Radiology 2017; 284:228-243. Electronically Signed   By: Elgie Collard M.D.   On: 04/24/2017 03:59   Mr 3d Recon At Scanner  Result Date: 04/24/2017 CLINICAL DATA:  Epigastric pain intermittently over the last 3 days with nausea. EXAM: MRI ABDOMEN WITHOUT AND WITH CONTRAST (INCLUDING MRCP) TECHNIQUE: Multiplanar multisequence MR imaging of the abdomen was performed both before and after the administration of intravenous contrast. Heavily T2-weighted images of the biliary and pancreatic ducts were obtained, and three-dimensional MRCP images were rendered by post processing. CONTRAST:  16mL MULTIHANCE GADOBENATE DIMEGLUMINE 529 MG/ML IV SOLN COMPARISON:  04/24/2017 FINDINGS: Lower chest: The small bilateral pulmonary nodules seen in the lung bases are again observed. Hepatobiliary: The common hepatic duct measures 8 mm in diameter and the common bile duct likewise measures 8 mm in diameter, with a slightly distended cystic duct remnant. In the distal common bile duct there is a 1.1 by 0.5 cm stone apparent on image 15/10. This corresponds to the calcification seen on CT. Posteriorly in the right hepatic lobe there is a 1.5 by 0.6 cm focus of subcapsular enhancement with indistinct anterior margin which becomes less apparent but still visible on delayed images, quite likely a small peripheral vascular malformation or potentially a small indistinct hemangioma, although the area does not demonstrate high precontrast T2 signal. Pancreas:  Unremarkable Spleen:  Unremarkable Adrenals/Urinary Tract:  Unremarkable Stomach/Bowel: Unremarkable Vascular/Lymphatic:  Unremarkable Other:  No supplemental non-categorized findings. Musculoskeletal: Unremarkable IMPRESSION: 1. 1.1 by 0.5 cm distal common bile duct stone compatible with choledocholithiasis. The common hepatic duct and common bile duct both measure about 8 mm in diameter, which could be related to low-grade obstruction from the stone or physiologic  dilatation associated with cholecystectomy. 2. The small peripheral focal enhancement posteriorly in the right hepatic lobe is again observed, seen on arterial phase images and on later delayed phase images, probably due to a small peripheral vascular malformation or less likely a small atypical hemangioma based on morphology and enhancement. 3. Right basilar and left basilar individual pulmonary nodules are observed, and surveillance recommendations from the CT abdomen are re-emphasized. Electronically Signed   By: Gaylyn Rong M.D.   On: 04/24/2017 07:15   Dg C-arm 1-60 Min-no Report  Result Date: 04/24/2017 Fluoroscopy was utilized by the requesting physician.  No radiographic interpretation.   Mr Abdomen Mrcp W Wo Contast  Result Date: 04/24/2017 CLINICAL DATA:  Epigastric pain intermittently over the last 3 days with nausea. EXAM: MRI ABDOMEN WITHOUT AND WITH CONTRAST (INCLUDING MRCP) TECHNIQUE: Multiplanar multisequence MR imaging of the abdomen was performed both before and after the administration of intravenous contrast. Heavily T2-weighted images of the biliary and pancreatic ducts were obtained, and three-dimensional MRCP images were rendered by post processing. CONTRAST:  16mL MULTIHANCE GADOBENATE DIMEGLUMINE 529 MG/ML IV SOLN COMPARISON:  04/24/2017 FINDINGS: Lower chest: The small bilateral  pulmonary nodules seen in the lung bases are again observed. Hepatobiliary: The common hepatic duct measures 8 mm in diameter and the common bile duct likewise measures 8 mm in diameter, with a slightly distended cystic duct remnant. In the distal common bile duct there is a 1.1 by 0.5 cm stone apparent on image 15/10. This corresponds to the calcification seen on CT. Posteriorly in the right hepatic lobe there is a 1.5 by 0.6 cm focus of subcapsular enhancement with indistinct anterior margin which becomes less apparent but still visible on delayed images, quite likely a small peripheral vascular  malformation or potentially a small indistinct hemangioma, although the area does not demonstrate high precontrast T2 signal. Pancreas:  Unremarkable Spleen:  Unremarkable Adrenals/Urinary Tract:  Unremarkable Stomach/Bowel: Unremarkable Vascular/Lymphatic:  Unremarkable Other:  No supplemental non-categorized findings. Musculoskeletal: Unremarkable IMPRESSION: 1. 1.1 by 0.5 cm distal common bile duct stone compatible with choledocholithiasis. The common hepatic duct and common bile duct both measure about 8 mm in diameter, which could be related to low-grade obstruction from the stone or physiologic dilatation associated with cholecystectomy. 2. The small peripheral focal enhancement posteriorly in the right hepatic lobe is again observed, seen on arterial phase images and on later delayed phase images, probably due to a small peripheral vascular malformation or less likely a small atypical hemangioma based on morphology and enhancement. 3. Right basilar and left basilar individual pulmonary nodules are observed, and surveillance recommendations from the CT abdomen are re-emphasized. Electronically Signed   By: Gaylyn Rong M.D.   On: 04/24/2017 07:15      Allergies as of 04/25/2017   No Known Allergies     Medication List    STOP taking these medications   methocarbamol 750 MG tablet Commonly known as:  ROBAXIN-750   methylPREDNISolone 4 MG Tbpk tablet Commonly known as:  MEDROL DOSEPAK     TAKE these medications   diclofenac 75 MG EC tablet Commonly known as:  VOLTAREN Take 75 mg by mouth as needed (migraines).   DULoxetine 20 MG capsule Commonly known as:  CYMBALTA Take 40 mg by mouth daily.   escitalopram 10 MG tablet Commonly known as:  LEXAPRO Take 10 mg by mouth daily. What changed:  Another medication with the same name was removed. Continue taking this medication, and follow the directions you see here.   SYNTHROID 175 MCG tablet Generic drug:  levothyroxine Take 175  mcg by mouth daily before breakfast. What changed:  Another medication with the same name was removed. Continue taking this medication, and follow the directions you see here.   ZOMIG 5 MG nasal solution Generic drug:  zolmitriptan Place 1 spray into both nostrils as needed for migraine.         Management plans discussed with the patient and she is in agreement. Stable for discharge home  Patient should follow up with needs a PCP d/w with her  CODE STATUS:     Code Status Orders  (From admission, onward)        Start     Ordered   04/24/17 0905  Full code  Continuous     04/24/17 0904    Code Status History    Date Active Date Inactive Code Status Order ID Comments User Context   09/17/2015 01:49 09/20/2015 18:15 Full Code [id number]  Hugelmeyer, Jon Gills, DO Inpatient      TOTAL TIME TAKING CARE OF THIS PATIENT: 38 minutes.    Note: This dictation was prepared with Dragon dictation along  with smaller phrase technology. Any transcriptional errors that result from this process are unintentional.  Eduardo Wurth M.D on 04/25/2017 at 9:36 AM  Between 7am to 6pm - Pager - [phone] After 6pm go to [email or web address] - password [email or web address]  [facility]  Office  [phone]  CC: Primary care physician; Patient, No Pcp Per

## 2017-04-25 NOTE — Progress Notes (Signed)
Rachel MoodKiran Aiden Helzer , MD [address], [address], [place], [place], [id number] [address], [place], [place], [address] Phone: [phone]  Fax: [phone]   Rachel Hurst is being followed for stones in CBD S/p ERCP  Day 1 of follow up   Subjective: Doing well no pain    Objective: Vital signs in last 24 hours: Vitals:   04/24/17 1334 04/24/17 2050 04/25/17 0053 04/25/17 0550  BP: (!) 108/59 (!) 85/51 (!) 98/52 (!) 93/46  Pulse: (!) 59 74 69 61  Resp: 14 18  18   Temp: 97.7 F (36.5 C) 98 F (36.7 C)  98.2 F (36.8 C)  TempSrc: Oral Oral  Oral  SpO2: 98% 95%  95%  Weight:      Height:       Weight change:   Intake/Output Summary (Last 24 hours) at 04/25/2017 1159 Last data filed at 04/25/2017 1008 Gross per 24 hour  Intake 4178 ml  Output 1875 ml  Net 2303 ml     Exam: Heart:: Regular rate and rhythm, S1S2 present or without murmur or extra heart sounds Lungs: normal, clear to auscultation and clear to auscultation and percussion Abdomen: soft, nontender, normal bowel sounds   Lab Results: @LABTEST2 @ Micro Results: No results found for this or any previous visit (from the past 240 hour(s)). Studies/Results: Ct Abdomen Pelvis W Contrast  Result Date: 04/24/2017 CLINICAL DATA:  42 year old female with acute abdominal pain. EXAM: CT ABDOMEN AND PELVIS WITH CONTRAST TECHNIQUE: Multidetector CT imaging of the abdomen and pelvis was performed using the standard protocol following bolus administration of intravenous contrast. CONTRAST:  100mL ISOVUE-300 IOPAMIDOL (ISOVUE-300) INJECTION 61% COMPARISON:  Lumbar spine radiograph dated 12/22/2015 FINDINGS: Lower chest: There is a 4 mm right lower lobe subpleural nodule (series 2, image 5). A nodule in the left lower lobe measures up to 12 mm in length. The visualized lung bases are otherwise clear. No intra-abdominal free air or free fluid. Hepatobiliary: There is a 1.5 x 0.8 cm focal lobular subcapsular  enhancement in the right lobe of the liver (series 2, image 18) which is not well characterized but may represent a focal vascular shunting or a small flash filling hemangioma. MRI may provide better characterisation. Cholecystectomy. No intrahepatic biliary ductal dilatation. There is a 10 mm focus of high attenuation within the central CBD which may represent retained calcified stone versus reflux of contrast from the duodenum. MRCP may provide better evaluation. Pancreas: No inflammatory changes or dilatation of the pancreatic duct. Spleen: Normal in size without focal abnormality. Adrenals/Urinary Tract: There is a focal area of parenchymal atrophy and scarring in the inferior pole of the left kidney. The kidneys are otherwise unremarkable. The visualized ureters and urinary bladder appear unremarkable. Stomach/Bowel: There is no bowel obstruction or active inflammation. Moderate stool noted throughout the colon. The appendix is normal. Vascular/Lymphatic: No significant vascular findings are present. No enlarged abdominal or pelvic lymph nodes. Reproductive: The uterus is anteverted and grossly unremarkable. An intrauterine device is noted. The ovaries are grossly unremarkable as visualized. There is a corpus luteum in the right ovary. Other: None Musculoskeletal: No acute or significant osseous findings. IMPRESSION: 1. No acute intra-abdominal or pelvic pathology. No bowel obstruction or active inflammation. Normal appendix. 2. Status post prior cholecystectomy. A 10 mm high attenuating focus in the central CBD may represent a retained calcified stone versus reflux of oral contrast. MRI may provide better evaluation. 3. Focal subcapsular enhancement in the right lobe of the liver,  not characterized, possibly a small vascular shunting or hemangioma. MRI may provide better characterization. 4. Bilateral pulmonary nodules. Non-contrast chest CT at 3-6 months is recommended. If the nodules are stable at time of  repeat CT, then future CT at 18-24 months (from today's scan) is considered optional for low-risk patients, but is recommended for high-risk patients. This recommendation follows the consensus statement: Guidelines for Management of Incidental Pulmonary Nodules Detected on CT Images: From the Fleischner Society 2017; Radiology 2017; 284:228-243. Electronically Signed   By: Elgie Collard M.D.   On: 04/24/2017 03:59   Mr 3d Recon At Scanner  Result Date: 04/24/2017 CLINICAL DATA:  Epigastric pain intermittently over the last 3 days with nausea. EXAM: MRI ABDOMEN WITHOUT AND WITH CONTRAST (INCLUDING MRCP) TECHNIQUE: Multiplanar multisequence MR imaging of the abdomen was performed both before and after the administration of intravenous contrast. Heavily T2-weighted images of the biliary and pancreatic ducts were obtained, and three-dimensional MRCP images were rendered by post processing. CONTRAST:  16mL MULTIHANCE GADOBENATE DIMEGLUMINE 529 MG/ML IV SOLN COMPARISON:  04/24/2017 FINDINGS: Lower chest: The small bilateral pulmonary nodules seen in the lung bases are again observed. Hepatobiliary: The common hepatic duct measures 8 mm in diameter and the common bile duct likewise measures 8 mm in diameter, with a slightly distended cystic duct remnant. In the distal common bile duct there is a 1.1 by 0.5 cm stone apparent on image 15/10. This corresponds to the calcification seen on CT. Posteriorly in the right hepatic lobe there is a 1.5 by 0.6 cm focus of subcapsular enhancement with indistinct anterior margin which becomes less apparent but still visible on delayed images, quite likely a small peripheral vascular malformation or potentially a small indistinct hemangioma, although the area does not demonstrate high precontrast T2 signal. Pancreas:  Unremarkable Spleen:  Unremarkable Adrenals/Urinary Tract:  Unremarkable Stomach/Bowel: Unremarkable Vascular/Lymphatic:  Unremarkable Other:  No supplemental  non-categorized findings. Musculoskeletal: Unremarkable IMPRESSION: 1. 1.1 by 0.5 cm distal common bile duct stone compatible with choledocholithiasis. The common hepatic duct and common bile duct both measure about 8 mm in diameter, which could be related to low-grade obstruction from the stone or physiologic dilatation associated with cholecystectomy. 2. The small peripheral focal enhancement posteriorly in the right hepatic lobe is again observed, seen on arterial phase images and on later delayed phase images, probably due to a small peripheral vascular malformation or less likely a small atypical hemangioma based on morphology and enhancement. 3. Right basilar and left basilar individual pulmonary nodules are observed, and surveillance recommendations from the CT abdomen are re-emphasized. Electronically Signed   By: Gaylyn Rong M.D.   On: 04/24/2017 07:15   Dg C-arm 1-60 Min-no Report  Result Date: 04/24/2017 Fluoroscopy was utilized by the requesting physician.  No radiographic interpretation.   Mr Abdomen Mrcp W Wo Contast  Result Date: 04/24/2017 CLINICAL DATA:  Epigastric pain intermittently over the last 3 days with nausea. EXAM: MRI ABDOMEN WITHOUT AND WITH CONTRAST (INCLUDING MRCP) TECHNIQUE: Multiplanar multisequence MR imaging of the abdomen was performed both before and after the administration of intravenous contrast. Heavily T2-weighted images of the biliary and pancreatic ducts were obtained, and three-dimensional MRCP images were rendered by post processing. CONTRAST:  16mL MULTIHANCE GADOBENATE DIMEGLUMINE 529 MG/ML IV SOLN COMPARISON:  04/24/2017 FINDINGS: Lower chest: The small bilateral pulmonary nodules seen in the lung bases are again observed. Hepatobiliary: The common hepatic duct measures 8 mm in diameter and the common bile duct likewise measures 8 mm in diameter,  with a slightly distended cystic duct remnant. In the distal common bile duct there is a 1.1 by 0.5 cm stone  apparent on image 15/10. This corresponds to the calcification seen on CT. Posteriorly in the right hepatic lobe there is a 1.5 by 0.6 cm focus of subcapsular enhancement with indistinct anterior margin which becomes less apparent but still visible on delayed images, quite likely a small peripheral vascular malformation or potentially a small indistinct hemangioma, although the area does not demonstrate high precontrast T2 signal. Pancreas:  Unremarkable Spleen:  Unremarkable Adrenals/Urinary Tract:  Unremarkable Stomach/Bowel: Unremarkable Vascular/Lymphatic:  Unremarkable Other:  No supplemental non-categorized findings. Musculoskeletal: Unremarkable IMPRESSION: 1. 1.1 by 0.5 cm distal common bile duct stone compatible with choledocholithiasis. The common hepatic duct and common bile duct both measure about 8 mm in diameter, which could be related to low-grade obstruction from the stone or physiologic dilatation associated with cholecystectomy. 2. The small peripheral focal enhancement posteriorly in the right hepatic lobe is again observed, seen on arterial phase images and on later delayed phase images, probably due to a small peripheral vascular malformation or less likely a small atypical hemangioma based on morphology and enhancement. 3. Right basilar and left basilar individual pulmonary nodules are observed, and surveillance recommendations from the CT abdomen are re-emphasized. Electronically Signed   By: Gaylyn Rong M.D.   On: 04/24/2017 07:15   Medications: I have reviewed the patient's current medications. Scheduled Meds: . DULoxetine  40 mg Oral Daily  . enoxaparin (LOVENOX) injection  40 mg Subcutaneous Q24H  . escitalopram  10 mg Oral Daily  . famotidine  20 mg Oral QHS  . levothyroxine  175 mcg Oral QAC breakfast   Continuous Infusions: . sodium chloride 125 mL/hr at 04/25/17 0450   PRN Meds:.acetaminophen **OR** acetaminophen, bisacodyl, morphine injection, ondansetron **OR**  ondansetron (ZOFRAN) IV, polyethylene glycol, traMADol   Assessment: Active Problems:   Cholelithiasis   Choledocholithiasis  Darcy Barbara 42 y.o. female with stones in the CBD seen on MRCP, S/p ERCP and removal of the stones on 04/24/17 by Dr Servando Snare .Sludge +stone extracted. S/p cholecystectomy in the past .   Plan: 1. Can go home and resume normal diet . I have answered all her questions and explained to her in detail about the ERCP she had yesterday.        LOS: 1 day   Rachel Mood, MD 04/25/2017, 11:59 AM

## 2017-05-01 ENCOUNTER — Telehealth: Payer: Self-pay

## 2017-05-01 NOTE — Telephone Encounter (Signed)
Called pt left message stating its time to schedule mammogram.

## 2017-05-28 IMAGING — CR DG CHOLANGIOGRAM OPERATIVE
6 series · 14 of 54 positions shown · non-contrast
Comparison: CT abdomen/ pelvis 09/17/2015 ; MRCP 09/16/2015

CLINICAL DATA: 39-year-old female undergoing cholecystectomy
symptomatic cholelithiasis.

EXAM:
INTRAOPERATIVE CHOLANGIOGRAM
TECHNIQUE: Cholangiographic images from the C-arm fluoroscopic device were
submitted for interpretation post-operatively. Please see the
procedural report for the amount of contrast and the fluoroscopy
time utilized.

[Series 12: cont. · 2 of 63 frames shown (1 of 5)]
[frame 7/63]
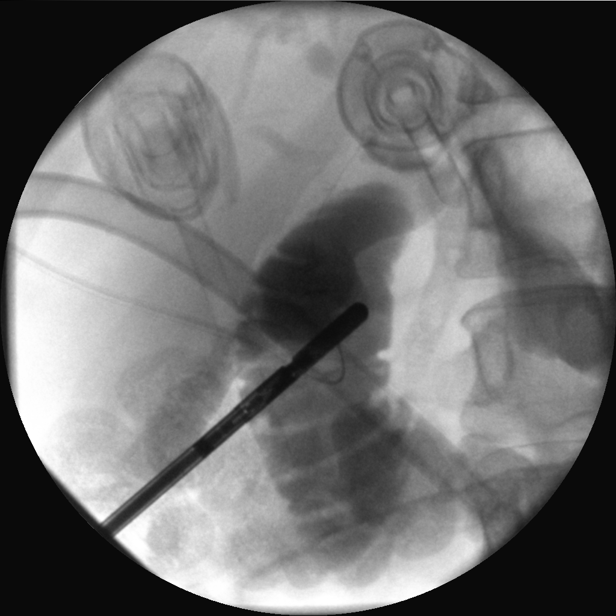
[frame 35/63]
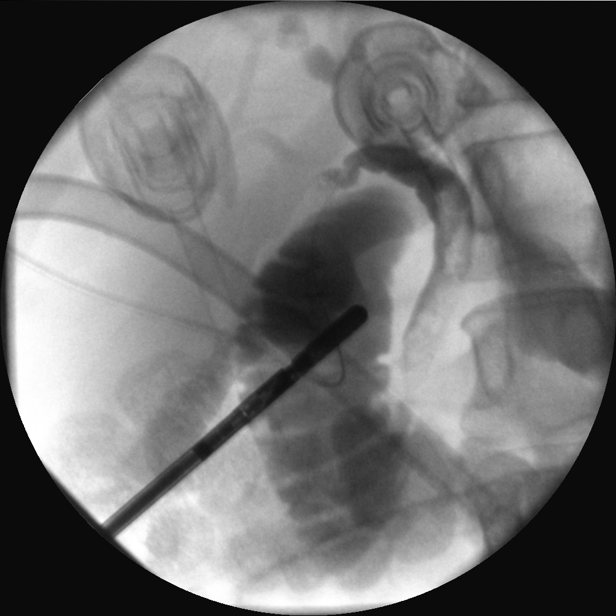

[Series 13: cont. · 3 of 128 frames shown (2 of 5)]
[frame 1/128]
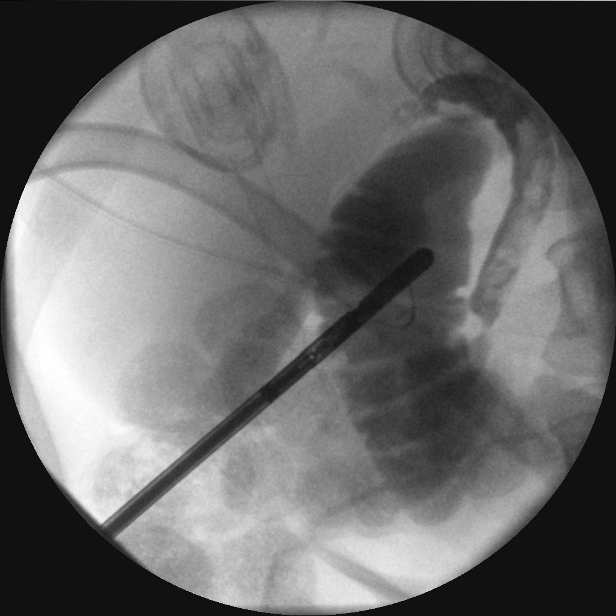
[frame 57/128]
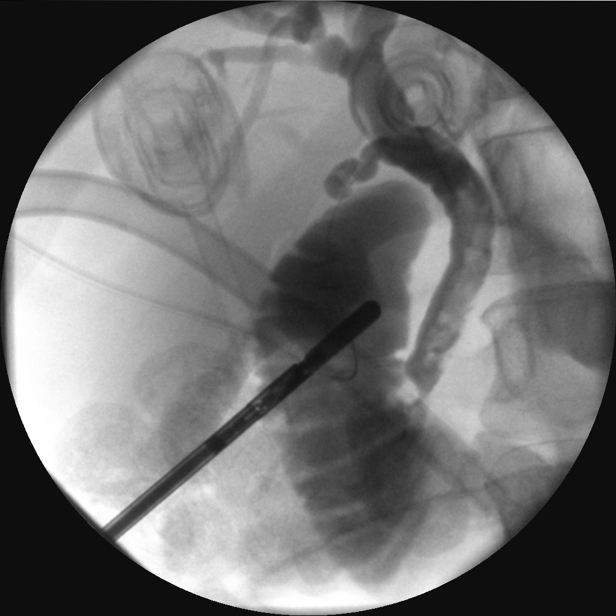
[frame 113/128]
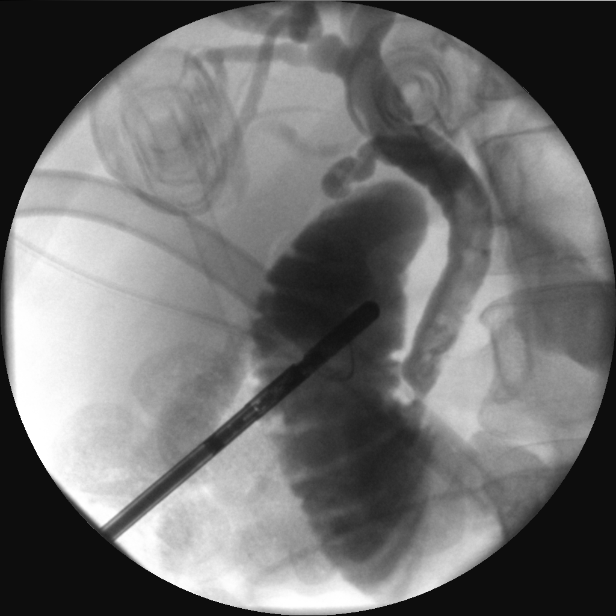

[Series 15: cont. · 2 of 72 frames shown (3 of 5)]
[frame 16/72]
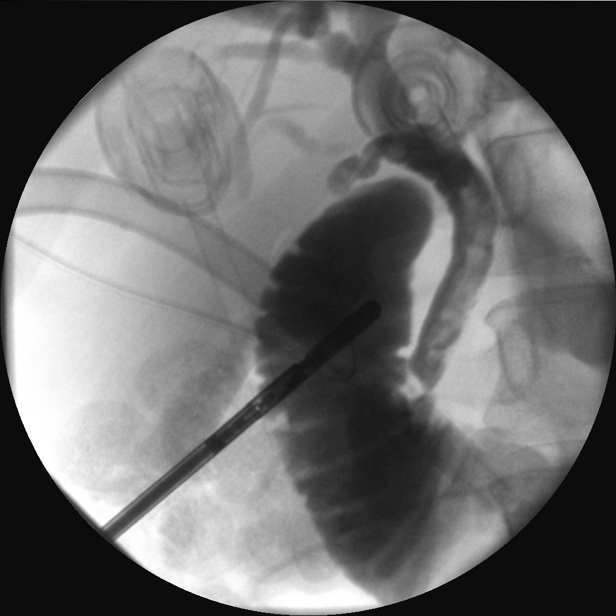
[frame 48/72]
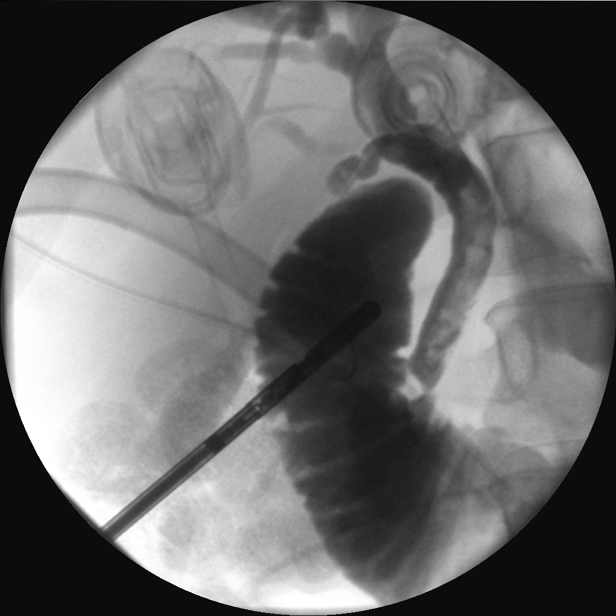

[Series 20: cont. · 3 of 59 frames shown (4 of 5)]
[frame 1/59]
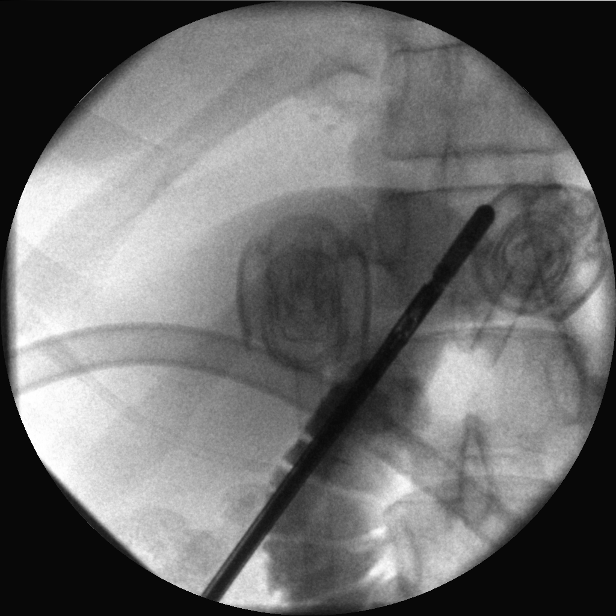
[frame 26/59]
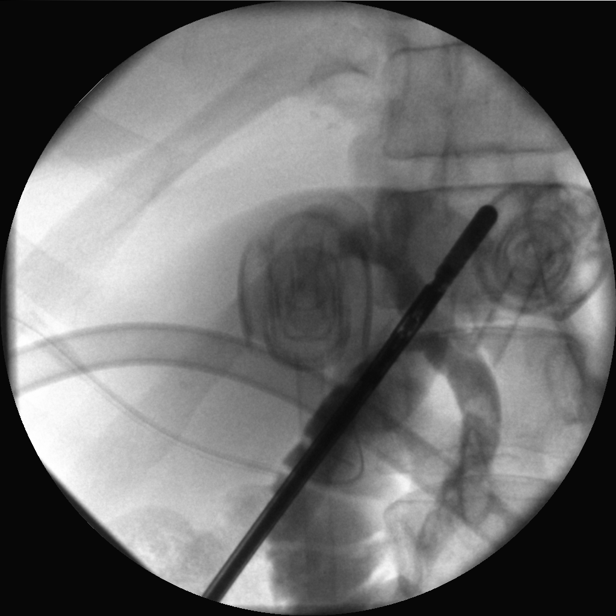
[frame 52/59]
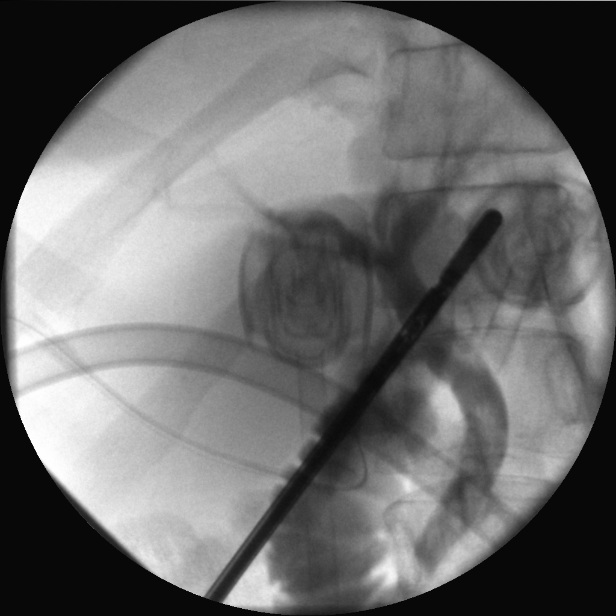

[Series 25: cont. · 2 of 39 frames shown (5 of 5)]
[frame 9/39]
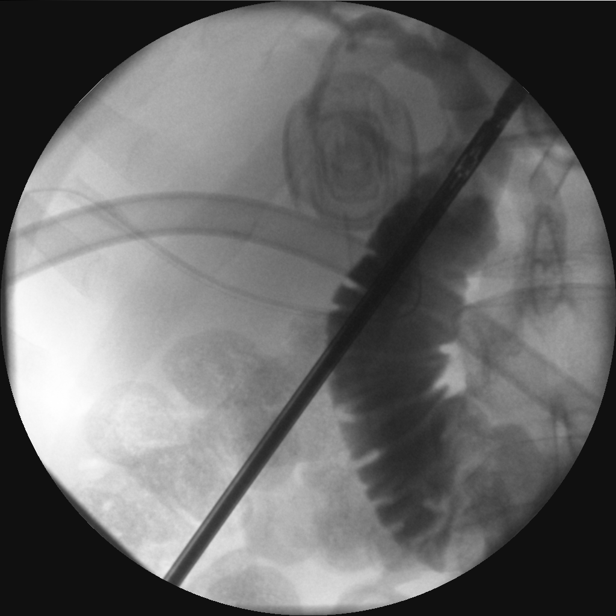
[frame 26/39]
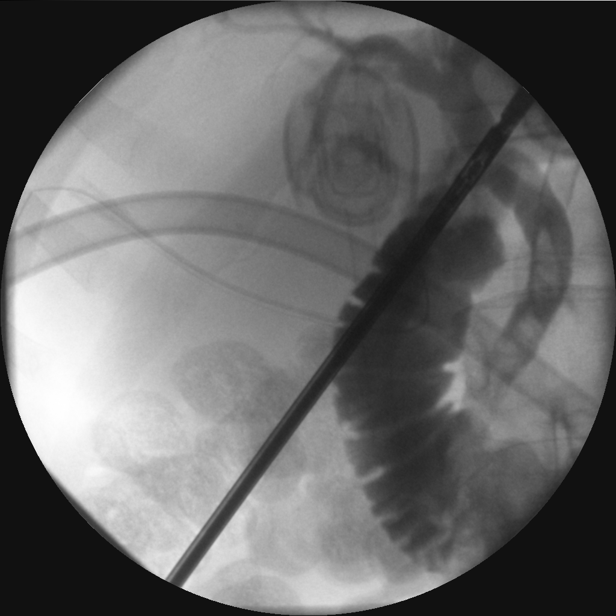

[Series 6001: (id) · 2 of 7 slices shown]
[im 2/7]
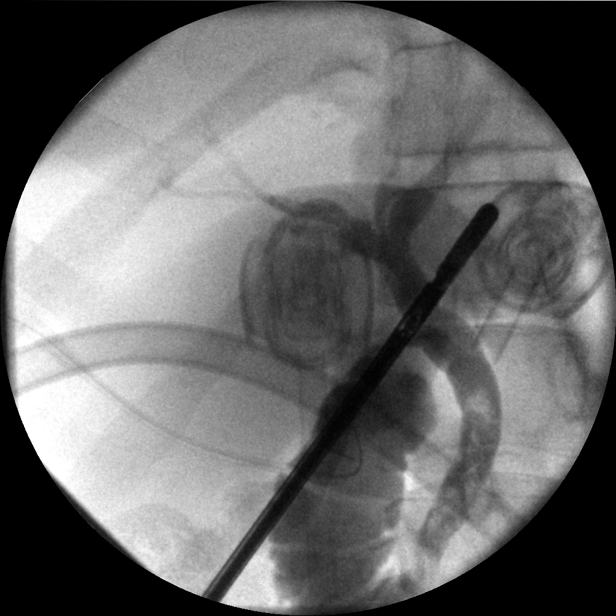
[im 6/7]
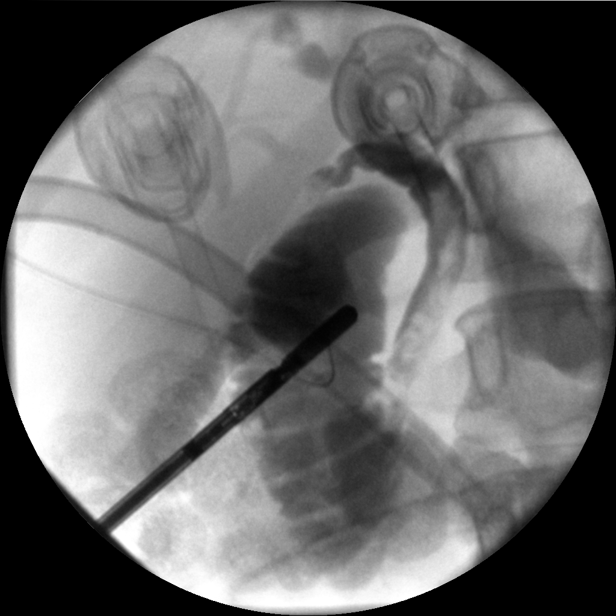

[14 of 54 positions shown; findings below may reference images not displayed]

FINDINGS: A total of 12 intraoperative saved images obtained during
intraoperative cholangiogram. The images demonstrate cannulation of
the cystic duct remnant and opacification of the biliary tree. There
is mild intra and extrahepatic biliary ductal dilatation. An
irregular ovoid filling defect is present in the common bile duct.
This is not obstructing. Contrast material passes through the
ampulla and into the duodenum. The filling defect is relatively
fixed and not mobile.
IMPRESSION: 1. Tubular nonocclusive filling defect in the common bile duct most
likely represents thrombus/hematoma in the post ERCP setting. A
would be difficult to completely exclude small residual
choledocholithiasis.
2. The ampulla is patent. Contrast material passes into the
duodenum.
3. Persistent mild intra and extrahepatic biliary ductal dilatation
is not unexpected.

## 2018-11-21 ENCOUNTER — Other Ambulatory Visit: Payer: Self-pay | Admitting: Pediatrics

## 2018-11-21 DIAGNOSIS — N6320 Unspecified lump in the left breast, unspecified quadrant: Secondary | ICD-10-CM

## 2018-11-21 DIAGNOSIS — Z1239 Encounter for other screening for malignant neoplasm of breast: Secondary | ICD-10-CM

## 2018-12-09 ENCOUNTER — Ambulatory Visit
Admission: EM | Admit: 2018-12-09 | Discharge: 2018-12-09 | Disposition: A | Discharge status: Home / Self Care | Payer: 59 | Attending: Family Medicine | Admitting: Family Medicine

## 2018-12-09 ENCOUNTER — Other Ambulatory Visit: Payer: Self-pay

## 2018-12-09 DIAGNOSIS — L237 Allergic contact dermatitis due to plants, except food: Secondary | ICD-10-CM | POA: Diagnosis not present

## 2018-12-09 MED ORDER — PREDNISONE 10 MG PO TABS: ORAL_TABLET | ORAL | 0 refills | Status: AC

## 2018-12-09 MED ORDER — HYDROXYZINE HCL 25 MG PO TABS: 25.0000 mg | ORAL_TABLET | Freq: Three times a day (TID) | ORAL | 0 refills | Status: AC | PRN

## 2018-12-09 NOTE — ED Provider Notes (Signed)
[facility] ____________________________________________  Time seen: Approximately 2:05 PM  I have reviewed the triage vital signs and the nursing notes.   HISTORY  Chief Complaint Rash   HPI Rachel Hurst is a 43 y.o. female presenting for evaluation of itchy rash.  Patient reports this past week she was working outside pulling plants and cutting brush.  States that she did have a low cut shirt and was dusting stuff all of her chest.  Patient reports within a day or 2 she noticed a rash to her chest and that has spread up her chest, to both arms and now to forehead.  Denies any other changes in foods, medicines, lotions, detergents or other contacts.  Unsure if poison oak or poison ivy was in that area.  Rash is very itchy.  Unresolved with over-the-counter topical creams and Benadryl.  Denies any chest pain, shortness of breath, difficulty swallowing or swelling.  Reports otherwise doing well.  No recent sickness.  No LMP recorded. (Menstrual status: IUD).    Past Medical History:  Diagnosis Date  . Anxiety   . Frequent headaches   . Hypothyroidism   . Migraine   . Thyroid disease 2006   Low thyroid    Patient Active Problem List   Diagnosis Date Noted  . Cholelithiasis 04/24/2017  . Choledocholithiasis   . Menstrual migraine without status migrainosus, not intractable 10/18/2016  . Hypothyroidism 04/27/2014  . Screening for breast cancer 04/27/2014  . Lump of breast, left 04/27/2014    Past Surgical History:  Procedure Laterality Date  . CHOLECYSTECTOMY N/A 09/18/2015   Procedure: LAPAROSCOPIC CHOLECYSTECTOMY WITH INTRAOPERATIVE CHOLANGIOGRAM and exploration common bile duct;  Surgeon: Leafy Roiego F Pabon, MD;  Location: [facility] ORS;  Service: General;  Laterality: N/A;  . ERCP N/A 09/17/2015   Procedure: ENDOSCOPIC RETROGRADE CHOLANGIOPANCREATOGRAPHY (ERCP);  Surgeon: Midge Miniumarren Wohl, MD;  [facility];  Service: Endoscopy;  Laterality: N/A;  . ERCP  N/A 04/24/2017   Procedure: ENDOSCOPIC RETROGRADE CHOLANGIOPANCREATOGRAPHY (ERCP);  Surgeon: Midge MiniumWohl, Darren, MD;  [facility];  Service: Endoscopy;  Laterality: N/A;  . TONSILLECTOMY AND ADENOIDECTOMY  1976     No current facility-administered medications for this encounter.   Current Outpatient Medications:  .  gabapentin (NEURONTIN) 300 MG capsule, Take by mouth., Disp: , Rfl:  .  levonorgestrel (MIRENA) 20 MCG/24HR IUD, 1 each by Intrauterine route once., Disp: , Rfl:  .  diclofenac (VOLTAREN) 75 MG EC tablet, Take 75 mg by mouth as needed (migraines)., Disp: , Rfl:  .  DULoxetine (CYMBALTA) 20 MG capsule, Take 40 mg by mouth daily. , Disp: , Rfl:  .  EMGALITY 120 MG/ML SOAJ, , Disp: , Rfl:  .  escitalopram (LEXAPRO) 10 MG tablet, Take 10 mg by mouth daily., Disp: , Rfl:  .  hydrOXYzine (ATARAX/VISTARIL) 25 MG tablet, Take 1 tablet (25 mg total) by mouth 3 (three) times daily as needed for itching., Disp: 15 tablet, Rfl: 0 .  predniSONE (DELTASONE) 10 MG tablet, Take 60mg  orally day one, then 55 mg orally day two, then 50 mg orally day three, then taper by 5 mg daily over 12 days until complete., Disp: 35 tablet, Rfl: 0 .  SYNTHROID 175 MCG tablet, Take 175 mcg by mouth daily before breakfast. , Disp: , Rfl:  .  ZOMIG 5 MG nasal solution, Place 1 spray into both nostrils as needed for migraine., Disp: , Rfl:   Allergies Patient has no known allergies.  Family History  Problem Relation Age of  Onset  . Hyperlipidemia Mother   . Hyperlipidemia Sister   . Diabetes Sister     Social History Social History   Tobacco Use  . Smoking status: Never Smoker  . Smokeless tobacco: Never Used  Substance Use Topics  . Alcohol use: Yes    Comment: occasionally  . Drug use: No    Review of Systems Constitutional: No fever ENT: No sore throat. Cardiovascular: Denies chest pain. Respiratory: Denies shortness of breath. Gastrointestinal: No abdominal pain.  Musculoskeletal:  Negative for back pain. Skin: Positive for rash.   ____________________________________________   PHYSICAL EXAM:  VITAL SIGNS: ED Triage Vitals  Enc Vitals Group     BP 12/09/18 1347 (!) 114/57     Pulse Rate 12/09/18 1347 74     Resp 12/09/18 1347 18     Temp 12/09/18 1347 98.4 F (36.9 C)     Temp Source 12/09/18 1347 Oral     SpO2 12/09/18 1347 100 %     Weight 12/09/18 1346 180 lb (81.6 kg)     Height 12/09/18 1346 5\' 4"  (1.626 m)     Head Circumference --      Peak Flow --      Pain Score 12/09/18 1345 5     Pain Loc --      Pain Edu? --      Excl. in [place]? --     Constitutional: Alert and oriented. Well appearing and in no acute distress. Eyes: Conjunctivae are normal.  ENT      Head: Normocephalic and atraumatic. Cardiovascular: Normal rate, regular rhythm. Grossly normal heart sounds.  Good peripheral circulation. Respiratory: Normal respiratory effort without tachypnea nor retractions. Breath sounds are clear and equal bilaterally. No wheezes, rales, rhonchi. Musculoskeletal: Steady gait. Neurologic:  Normal speech and language.  Speech is normal. No gait instability.  Skin:  Skin is warm, dry except: Mildly erythematous papular rash in clusters to anterior mid and right upper chest going along both sides of neck, few areas to forehead and bilateral forearms, pruritic, no surrounding erythema, no drainage. Psychiatric: Mood and affect are normal. Speech and behavior are normal. Patient exhibits appropriate insight and judgment   ___________________________________________   LABS (all labs ordered are listed, but only abnormal results are displayed)  Labs Reviewed - No data to display ____________________________________________  PROCEDURES Procedures    INITIAL IMPRESSION / ASSESSMENT AND PLAN / ED COURSE  Pertinent labs & imaging results that were available during my care of the patient were reviewed by me and considered in my medical decision making (see  chart for details).  Well-appearing patient.  No acute distress.  Clinical appearance and subjective report consistent with contact dermatitis, suspect plant.  Will treat with prednisone taper, PRN hydroxyzine.  Supportive care avoidance of scratching and trigger. Discussed indication, risks and benefits of medications with patient.  Discussed follow up with Primary care physician this week. Discussed follow up and return parameters including no resolution or any worsening concerns. Patient verbalized understanding and agreed to plan.   ____________________________________________   FINAL CLINICAL IMPRESSION(S) / ED DIAGNOSES  Final diagnoses:  Allergic contact dermatitis due to plants, except food     ED Discharge Orders         Ordered    predniSONE (DELTASONE) 10 MG tablet     12/09/18 1414    hydrOXYzine (ATARAX/VISTARIL) 25 MG tablet  3 times daily PRN     12/09/18 1414  Note: This dictation was prepared with Dragon dictation along with smaller phrase technology. Any transcriptional errors that result from this process are unintentional.         Renford Dills, NP 12/09/18 1448

## 2018-12-09 NOTE — Discharge Instructions (Addendum)
Take medication as prescribed.Avoid scratching.  ° °Follow up with your primary care physician this week as needed. Return to Urgent care for new or worsening concerns.  ° °

## 2018-12-09 NOTE — ED Triage Notes (Signed)
Pt was outside trimming trees and working with plants on Thursday. 1-2 days later started itching between her breasts. Now with itchy red rash to chest and arms and face

## 2018-12-16 ENCOUNTER — Ambulatory Visit
Admission: RE | Admit: 2018-12-16 | Discharge: 2018-12-16 | Disposition: A | Discharge status: Home / Self Care | Payer: 59 | Source: Ambulatory Visit | Attending: Pediatrics | Admitting: Pediatrics

## 2018-12-16 DIAGNOSIS — Z1239 Encounter for other screening for malignant neoplasm of breast: Secondary | ICD-10-CM | POA: Insufficient documentation

## 2018-12-16 DIAGNOSIS — N6320 Unspecified lump in the left breast, unspecified quadrant: Secondary | ICD-10-CM | POA: Insufficient documentation

## 2020-04-09 ENCOUNTER — Encounter: Payer: Self-pay | Admitting: Obstetrics & Gynecology

## 2020-04-09 ENCOUNTER — Ambulatory Visit (INDEPENDENT_AMBULATORY_CARE_PROVIDER_SITE_OTHER): Payer: Medicaid Other | Admitting: Obstetrics & Gynecology

## 2020-04-09 ENCOUNTER — Other Ambulatory Visit (HOSPITAL_COMMUNITY)
Admission: RE | Admit: 2020-04-09 | Discharge: 2020-04-09 | Disposition: A | Discharge status: Home / Self Care | Payer: 59 | Source: Ambulatory Visit | Attending: Obstetrics & Gynecology | Admitting: Obstetrics & Gynecology

## 2020-04-09 ENCOUNTER — Other Ambulatory Visit: Payer: Self-pay

## 2020-04-09 VITALS — BP 100/60 | Ht 64.0 in | Wt 183.0 lb

## 2020-04-09 DIAGNOSIS — Z1231 Encounter for screening mammogram for malignant neoplasm of breast: Secondary | ICD-10-CM | POA: Diagnosis not present

## 2020-04-09 DIAGNOSIS — Z30432 Encounter for removal of intrauterine contraceptive device: Secondary | ICD-10-CM

## 2020-04-09 DIAGNOSIS — Z01419 Encounter for gynecological examination (general) (routine) without abnormal findings: Secondary | ICD-10-CM

## 2020-04-09 DIAGNOSIS — Z124 Encounter for screening for malignant neoplasm of cervix: Secondary | ICD-10-CM

## 2020-04-09 MED ORDER — CLOTRIMAZOLE-BETAMETHASONE 1-0.05 % EX CREA: 1.0000 "application " | TOPICAL_CREAM | Freq: Two times a day (BID) | CUTANEOUS | 0 refills | Status: AC

## 2020-04-09 NOTE — Patient Instructions (Signed)
PAP every three years Mammogram every year    Call [phone] to schedule at [place] Colonoscopy every 10 years Labs yearly (with PCP)  Thank you for choosing [facility]. As part of our ongoing efforts to improve patient experience, we would appreciate your feedback. Please fill out the short survey that you will receive by mail or MyChart. Your opinion is important to us! - Dr. Alanta Scobey   

## 2020-04-09 NOTE — Progress Notes (Signed)
HPI:      [name] is a 45 y.o. [phone] who LMP was No LMP recorded. (Menstrual status: IUD)., she presents today for her annual examination. The patient has no complaints today other than concerns that her hormones from IUD may be contributing to her vaginal irritation and vulvar irritation. The patient is not currently sexually active.  She is recently separated.  Additional stress this past year.  Not working due to complexity of her migraines.  Her last pap: approximate date 2018 and was normal and last mammogram: approximate date 2021 and was normal. The patient does perform self breast exams.  There is no notable family history of breast or ovarian cancer in her family.  The patient has regular exercise: yes.  The patient denies current symptoms of depression.    GYN History: Contraception: IUD  PMHx: Past Medical History:  Diagnosis Date  . Anxiety   . Frequent headaches   . Hypothyroidism   . Migraine   . Thyroid disease 2006   Low thyroid   Past Surgical History:  Procedure Laterality Date  . CHOLECYSTECTOMY N/A 09/18/2015   Procedure: LAPAROSCOPIC CHOLECYSTECTOMY WITH INTRAOPERATIVE CHOLANGIOGRAM and exploration common bile duct;  Surgeon: Leafy Ro, MD;  Location: [facility] ORS;  Service: General;  Laterality: N/A;  . ERCP N/A 09/17/2015   Procedure: ENDOSCOPIC RETROGRADE CHOLANGIOPANCREATOGRAPHY (ERCP);  Surgeon: Midge Minium, MD;  Location: [facility] ENDOSCOPY;  Service: Endoscopy;  Laterality: N/A;  . ERCP N/A 04/24/2017   Procedure: ENDOSCOPIC RETROGRADE CHOLANGIOPANCREATOGRAPHY (ERCP);  Surgeon: Midge Minium, MD;  Location: [facility] ENDOSCOPY;  Service: Endoscopy;  Laterality: N/A;  . TONSILLECTOMY AND ADENOIDECTOMY  1976   Family History  Problem Relation Age of Onset  . Hyperlipidemia Mother   . Hyperlipidemia Sister   . Diabetes Sister    Social History   Tobacco Use  . Smoking status: Never Smoker  . Smokeless tobacco: Never Used  Vaping Use  . Vaping  Use: Never used  Substance Use Topics  . Alcohol use: Yes    Comment: occasionally  . Drug use: No    Current Outpatient Medications:  .  clotrimazole-betamethasone (LOTRISONE) cream, Apply 1 application topically 2 (two) times daily., Disp: 30 g, Rfl: 0 .  escitalopram (LEXAPRO) 10 MG tablet, Take 10 mg by mouth daily., Disp: , Rfl:  .  gabapentin (NEURONTIN) 300 MG capsule, Take by mouth., Disp: , Rfl:  .  levonorgestrel (MIRENA) 20 MCG/24HR IUD, 1 each by Intrauterine route once., Disp: , Rfl:  .  SYNTHROID 175 MCG tablet, Take 175 mcg by mouth daily before breakfast. , Disp: , Rfl:  .  diclofenac (VOLTAREN) 75 MG EC tablet, Take 75 mg by mouth as needed (migraines). (Patient not taking: Reported on 04/09/2020), Disp: , Rfl:  .  DULoxetine (CYMBALTA) 20 MG capsule, Take 40 mg by mouth daily.  (Patient not taking: Reported on 04/09/2020), Disp: , Rfl:  .  EMGALITY 120 MG/ML SOAJ, , Disp: , Rfl:  .  hydrOXYzine (ATARAX/VISTARIL) 25 MG tablet, Take 1 tablet (25 mg total) by mouth 3 (three) times daily as needed for itching. (Patient not taking: Reported on 04/09/2020), Disp: 15 tablet, Rfl: 0 .  predniSONE (DELTASONE) 10 MG tablet, Take 60mg  orally day one, then 55 mg orally day two, then 50 mg orally day three, then taper by 5 mg daily over 12 days until complete. (Patient not taking: Reported on 04/09/2020), Disp: 35 tablet, Rfl: 0 .  QULIPTA 60 MG TABS, Take 1 tablet  by mouth daily., Disp: , Rfl:  .  ZOMIG 5 MG nasal solution, Place 1 spray into both nostrils as needed for migraine. (Patient not taking: Reported on 04/09/2020), Disp: , Rfl:  Allergies: Patient has no known allergies.  Review of Systems  Constitutional: Negative for chills, fever and malaise/fatigue.  HENT: Negative for congestion, sinus pain and sore throat.   Eyes: Negative for blurred vision and pain.  Respiratory: Negative for cough and wheezing.   Cardiovascular: Negative for chest pain and leg swelling.   Gastrointestinal: Negative for abdominal pain, constipation, diarrhea, heartburn, nausea and vomiting.  Genitourinary: Negative for dysuria, frequency, hematuria and urgency.  Musculoskeletal: Negative for back pain, joint pain, myalgias and neck pain.  Skin: Negative for itching and rash.  Neurological: Positive for headaches. Negative for dizziness, tremors and weakness.  Endo/Heme/Allergies: Does not bruise/bleed easily.  Psychiatric/Behavioral: Negative for depression. The patient is nervous/anxious. The patient does not have insomnia.     Objective: BP 100/60   Ht 5\' 4"  (1.626 m)   Wt 183 lb (83 kg)   BMI 31.41 kg/m   Filed Weights   04/09/20 1508  Weight: 183 lb (83 kg)   Body mass index is 31.41 kg/m. Physical Exam Constitutional:      General: She is not in acute distress.    Appearance: She is well-developed and well-nourished.  Genitourinary:     Vagina, uterus and rectum normal.     There is no rash or lesion on the right labia.     There is no rash or lesion on the left labia.    No lesions in the vagina.     No vaginal bleeding.      Right Adnexa: not tender and no mass present.    Left Adnexa: not tender and no mass present.    No cervical motion tenderness, friability, lesion or polyp.     IUD strings visualized.     Uterus is mobile.     Uterus is not enlarged.     No uterine mass detected.    Uterus is midaxial.     Pelvic exam was performed with patient in the lithotomy position.  Breasts:     Right: No mass, skin change or tenderness.     Left: No mass, skin change or tenderness.    HENT:     Head: Normocephalic and atraumatic. No laceration.     Right Ear: Hearing normal.     Left Ear: Hearing normal.     Nose: No epistaxis or foreign body.     Mouth/Throat:     Mouth: Oropharynx is clear and moist and mucous membranes are normal.     Pharynx: Uvula midline.  Eyes:     Pupils: Pupils are equal, round, and reactive to light.  Neck:      Thyroid: No thyromegaly.  Cardiovascular:     Rate and Rhythm: Normal rate and regular rhythm.     Heart sounds: No murmur heard. No friction rub. No gallop.   Pulmonary:     Effort: Pulmonary effort is normal. No respiratory distress.     Breath sounds: Normal breath sounds. No wheezing.  Abdominal:     General: Bowel sounds are normal. There is no distension.     Palpations: Abdomen is soft.     Tenderness: There is no abdominal tenderness. There is no rebound.  Musculoskeletal:        General: Normal range of motion.     Cervical back: Normal  range of motion and neck supple.  Neurological:     Mental Status: She is alert and oriented to person, place, and time.     Cranial Nerves: No cranial nerve deficit.  Skin:    General: Skin is warm and dry.  Psychiatric:        Mood and Affect: Mood and affect normal.        Judgment: Judgment normal.  Vitals reviewed.     Assessment:  ANNUAL EXAM 1. Women's annual routine gynecological examination   2. Screening for cervical cancer   3. Encounter for screening mammogram for malignant neoplasm of breast   4. Encounter for IUD removal    Screening Plan:            1.  Cervical Screening-  Pap smear done today  2. Breast screening- Exam annually and mammogram>40 planned   3. Colonoscopy every 10 years, Hemoccult testing - after age 17 or even 81  4. Labs managed by PCP  5. Counseling for contraception: abstinence  Not planning relationship any time soon    6. Encounter for IUD removal See if affects vulvar irritation Monitor cycles, s/sx menopause  IUD Removal Strings of IUD identified and grasped.  IUD removed without problem.  Pt tolerated this well.  IUD noted to be intact.  IUD removed and plan for contraception is abstinence. She was amenable to this plan.     F/U  Return in about 1 year (around 04/09/2021) for Annual.  Annamarie Major, MD, [facility], [facility] 04/09/2020  3:35 PM

## 2020-04-14 LAB — CYTOLOGY - PAP
Comment: NEGATIVE
Diagnosis: NEGATIVE
High risk HPV: NEGATIVE

## 2020-12-14 ENCOUNTER — Other Ambulatory Visit: Payer: Self-pay | Admitting: Family Medicine

## 2020-12-14 DIAGNOSIS — Z1231 Encounter for screening mammogram for malignant neoplasm of breast: Secondary | ICD-10-CM

## 2020-12-31 ENCOUNTER — Other Ambulatory Visit: Payer: Self-pay

## 2020-12-31 ENCOUNTER — Ambulatory Visit
Admission: RE | Admit: 2020-12-31 | Discharge: 2020-12-31 | Disposition: A | Discharge status: Home / Self Care | Payer: Medicaid Other | Source: Ambulatory Visit | Attending: Family Medicine | Admitting: Family Medicine

## 2020-12-31 DIAGNOSIS — Z1231 Encounter for screening mammogram for malignant neoplasm of breast: Secondary | ICD-10-CM | POA: Insufficient documentation

## 2021-10-25 DIAGNOSIS — Z9889 Other specified postprocedural states: Secondary | ICD-10-CM | POA: Diagnosis not present

## 2021-10-25 DIAGNOSIS — Z113 Encounter for screening for infections with a predominantly sexual mode of transmission: Secondary | ICD-10-CM | POA: Diagnosis not present

## 2021-10-25 DIAGNOSIS — R102 Pelvic and perineal pain: Secondary | ICD-10-CM | POA: Diagnosis not present

## 2021-10-25 DIAGNOSIS — Z30013 Encounter for initial prescription of injectable contraceptive: Secondary | ICD-10-CM | POA: Diagnosis not present

## 2022-02-07 ENCOUNTER — Emergency Department
Admission: EM | Admit: 2022-02-07 | Discharge: 2022-02-08 | Disposition: A | Discharge status: Home / Self Care | Payer: Medicaid Other | Attending: Emergency Medicine | Admitting: Emergency Medicine

## 2022-02-07 ENCOUNTER — Encounter: Payer: Self-pay | Admitting: Emergency Medicine

## 2022-02-07 ENCOUNTER — Other Ambulatory Visit: Payer: Self-pay

## 2022-02-07 DIAGNOSIS — S0501XA Injury of conjunctiva and corneal abrasion without foreign body, right eye, initial encounter: Secondary | ICD-10-CM | POA: Diagnosis not present

## 2022-02-07 DIAGNOSIS — W541XXA Struck by dog, initial encounter: Secondary | ICD-10-CM | POA: Insufficient documentation

## 2022-02-07 NOTE — ED Notes (Signed)
Visual acuity attempted at this time and was unsuccessful. Pt stated, "when I try to open my left eye, my right eye starts to hurt." Pt has both eyes closed at this time.

## 2022-02-07 NOTE — ED Triage Notes (Signed)
Pt to triage via w/c with no distress noted; c/o rt eye pain after her dog scratched it

## 2022-02-08 MED ORDER — OFLOXACIN 0.3 % OP SOLN
1.0000 [drp] | Freq: Once | OPHTHALMIC | Status: AC
  Administered 2022-02-08: 1 [drp] via OPHTHALMIC
  Filled 2022-02-08: qty 5

## 2022-02-08 MED ORDER — FLUORESCEIN SODIUM 1 MG OP STRP
1.0000 | ORAL_STRIP | Freq: Once | OPHTHALMIC | Status: AC
  Administered 2022-02-08: 1 via OPHTHALMIC
  Filled 2022-02-08: qty 1

## 2022-02-08 MED ORDER — IBUPROFEN 600 MG PO TABS
600.0000 mg | ORAL_TABLET | Freq: Once | ORAL | Status: AC
  Administered 2022-02-08: 600 mg via ORAL
  Filled 2022-02-08: qty 1

## 2022-02-08 MED ORDER — KETOROLAC TROMETHAMINE 0.5 % OP SOLN: 1.0000 [drp] | Freq: Two times a day (BID) | OPHTHALMIC | 0 refills | Status: AC

## 2022-02-08 MED ORDER — OFLOXACIN 0.3 % OP SOLN: 1.0000 [drp] | Freq: Four times a day (QID) | OPHTHALMIC | 0 refills | Status: AC

## 2022-02-08 MED ORDER — OXYCODONE HCL 5 MG PO TABS: 5.0000 mg | ORAL_TABLET | Freq: Four times a day (QID) | ORAL | 0 refills | Status: AC | PRN

## 2022-02-08 MED ORDER — HYPROMELLOSE (GONIOSCOPIC) 2.5 % OP SOLN: 1.0000 [drp] | Freq: Three times a day (TID) | OPHTHALMIC | 12 refills | Status: AC

## 2022-02-08 MED ORDER — ACETAMINOPHEN 500 MG PO TABS
1000.0000 mg | ORAL_TABLET | Freq: Once | ORAL | Status: AC
  Administered 2022-02-08: 1000 mg via ORAL
  Filled 2022-02-08: qty 2

## 2022-02-08 MED ORDER — TETRACAINE HCL 0.5 % OP SOLN
1.0000 [drp] | Freq: Once | OPHTHALMIC | Status: AC
  Administered 2022-02-08: 1 [drp] via OPHTHALMIC
  Filled 2022-02-08: qty 4

## 2022-02-08 NOTE — ED Provider Notes (Signed)
[facility] Provider Note    Event Date/Time   First MD Initiated Contact with Patient 02/08/22 [phone]     (approximate)   History   Eye Pain   HPI  Rachel Hurst is a 46 y.o. female   Past medical history of noncontributory health history who presents to the emergency department with an eye injury after her dog scratched her eye.  No other injury sustained.  Significant pain to the right eye.   Tetanus up-to-date in the last 10 years. No contact lens wear.  History was obtained via patient.  Her niece is available as an independent historian corroborates information given above.      Physical Exam   Triage Vital Signs: ED Triage Vitals  Enc Vitals Group     BP 02/07/22 2147 113/65     Pulse Rate 02/07/22 2147 83     Resp 02/07/22 2147 20     Temp 02/07/22 2147 98.2 F (36.8 C)     Temp Source 02/07/22 2147 Oral     SpO2 02/07/22 2147 96 %     Weight 02/07/22 2135 175 lb (79.4 kg)     Height 02/07/22 2135 5\' 4"  (1.626 m)     Head Circumference --      Peak Flow --      Pain Score 02/07/22 2135 8     Pain Loc --      Pain Edu? --      Excl. in GC? --     Most recent vital signs: Vitals:   02/07/22 2147  BP: 113/65  Pulse: 83  Resp: 20  Temp: 98.2 F (36.8 C)  SpO2: 96%    General: Awake, no distress.  CV:  Good peripheral perfusion. Resp:  Normal effort.  Abd:  No distention.  Other:  There is a corneal abrasion at the 12 o'clock position approximately 3 to 4 mm, no Seidel sign   ED Results / Procedures / Treatments   Labs (all labs ordered are listed, but only abnormal results are displayed) Labs Reviewed - No data to display    PROCEDURES:  Critical Care performed: No  Procedures   MEDICATIONS ORDERED IN ED: Medications  ofloxacin (OCUFLOX) 0.3 % ophthalmic solution 1 drop (has no administration in time range)  tetracaine (PONTOCAINE) 0.5 % ophthalmic solution 1-2 drop (1 drop Both Eyes Given by Other  02/08/22 0118)  fluorescein ophthalmic strip 1 strip (1 strip Both Eyes Given by Other 02/08/22 0119)  acetaminophen (TYLENOL) tablet 1,000 mg (1,000 mg Oral Given 02/08/22 0043)  ibuprofen (ADVIL) tablet 600 mg (600 mg Oral Given 02/08/22 0043)    IMPRESSION / MDM / ASSESSMENT AND PLAN / ED COURSE  I reviewed the triage vital signs and the nursing notes.                              Differential diagnosis includes, but is not limited to, corneal abrasion, corneal ulceration, open globe,    MDM: Patient with a corneal abrasion after her dog scratched her eye, tetanus up-to-date.  Pain control, antibiotic drops, [facility] follow-up tomorrow morning.   Patient's presentation is most consistent with acute presentation with potential threat to life or bodily function.       FINAL CLINICAL IMPRESSION(S) / ED DIAGNOSES   Final diagnoses:  Abrasion of right cornea, initial encounter     Rx / DC Orders   ED  Discharge Orders          Ordered    ofloxacin (OCUFLOX) 0.3 % ophthalmic solution  4 times daily        02/08/22 0134    hydroxypropyl methylcellulose / hypromellose (ISOPTO TEARS / GONIOVISC) 2.5 % ophthalmic solution  3 times daily        02/08/22 0134    ketorolac (ACULAR) 0.5 % ophthalmic solution  2 times daily        02/08/22 0134    oxyCODONE (ROXICODONE) 5 MG immediate release tablet  Every 6 hours PRN        02/08/22 0134             Note:  This document was prepared using Dragon voice recognition software and may include unintentional dictation errors.    Pilar Jarvis, MD 02/08/22 [phone]

## 2022-02-08 NOTE — Discharge Instructions (Signed)
Use eyedrops and antibiotic drops as prescribed.  Call [facility] this morning for a follow-up appointment.  Take acetaminophen 650 mg and ibuprofen 400 mg every 6 hours for pain.  Take with food. Use oxycodone for severe/breakthrough pain as we discussed.

## 2022-03-10 DIAGNOSIS — N939 Abnormal uterine and vaginal bleeding, unspecified: Secondary | ICD-10-CM | POA: Diagnosis not present

## 2022-04-07 DIAGNOSIS — Z30430 Encounter for insertion of intrauterine contraceptive device: Secondary | ICD-10-CM | POA: Diagnosis not present

## 2022-08-22 DIAGNOSIS — D329 Benign neoplasm of meninges, unspecified: Secondary | ICD-10-CM | POA: Diagnosis not present

## 2022-08-22 DIAGNOSIS — G43719 Chronic migraine without aura, intractable, without status migrainosus: Secondary | ICD-10-CM | POA: Diagnosis not present

## 2022-10-11 DIAGNOSIS — D329 Benign neoplasm of meninges, unspecified: Secondary | ICD-10-CM | POA: Diagnosis not present

## 2023-02-02 DIAGNOSIS — M160 Bilateral primary osteoarthritis of hip: Secondary | ICD-10-CM | POA: Diagnosis not present

## 2023-02-02 DIAGNOSIS — M76891 Other specified enthesopathies of right lower limb, excluding foot: Secondary | ICD-10-CM | POA: Diagnosis not present

## 2023-02-02 DIAGNOSIS — M76892 Other specified enthesopathies of left lower limb, excluding foot: Secondary | ICD-10-CM | POA: Diagnosis not present

## 2023-04-24 DIAGNOSIS — Z131 Encounter for screening for diabetes mellitus: Secondary | ICD-10-CM | POA: Diagnosis not present

## 2023-04-24 DIAGNOSIS — Z136 Encounter for screening for cardiovascular disorders: Secondary | ICD-10-CM | POA: Diagnosis not present

## 2023-04-24 DIAGNOSIS — E063 Autoimmune thyroiditis: Secondary | ICD-10-CM | POA: Diagnosis not present

## 2023-04-24 DIAGNOSIS — F419 Anxiety disorder, unspecified: Secondary | ICD-10-CM | POA: Diagnosis not present

## 2024-01-04 DIAGNOSIS — M76891 Other specified enthesopathies of right lower limb, excluding foot: Secondary | ICD-10-CM | POA: Diagnosis not present

## 2024-01-04 DIAGNOSIS — M5136 Other intervertebral disc degeneration, lumbar region with discogenic back pain only: Secondary | ICD-10-CM | POA: Diagnosis not present

## 2024-01-04 DIAGNOSIS — M76892 Other specified enthesopathies of left lower limb, excluding foot: Secondary | ICD-10-CM | POA: Diagnosis not present

## 2024-02-04 ENCOUNTER — Encounter: Payer: Self-pay | Admitting: Family Medicine

## 2024-03-04 ENCOUNTER — Encounter: Payer: Self-pay | Admitting: Family Medicine
